# Patient Record
Sex: Male | Born: 1940
Health system: Southern US, Community
[De-identification: ages and names within clinical notes are randomized; demographics above are authoritative.]

## PROBLEM LIST (undated history)

## (undated) DIAGNOSIS — I10 Essential (primary) hypertension: Secondary | ICD-10-CM

## (undated) DIAGNOSIS — Z789 Other specified health status: Secondary | ICD-10-CM

## (undated) DIAGNOSIS — Z7289 Other problems related to lifestyle: Secondary | ICD-10-CM

## (undated) DIAGNOSIS — F419 Anxiety disorder, unspecified: Secondary | ICD-10-CM

## (undated) DIAGNOSIS — K219 Gastro-esophageal reflux disease without esophagitis: Secondary | ICD-10-CM

## (undated) HISTORY — PX: OTHER SURGICAL HISTORY: SHX169

---

## 2005-05-11 ENCOUNTER — Ambulatory Visit: Payer: Self-pay | Admitting: Pain Medicine

## 2005-05-15 ENCOUNTER — Ambulatory Visit: Payer: Self-pay | Admitting: Pain Medicine

## 2005-06-20 ENCOUNTER — Ambulatory Visit: Payer: Self-pay | Admitting: Pain Medicine

## 2005-07-03 ENCOUNTER — Ambulatory Visit: Payer: Self-pay | Admitting: Pain Medicine

## 2012-08-30 ENCOUNTER — Inpatient Hospital Stay: Payer: Self-pay | Admitting: Internal Medicine

## 2012-08-30 LAB — CSF CELL COUNT WITH DIFFERENTIAL
CSF Tube #: 3
Eosinophil: 0 %
Eosinophil: 0 %
Lymphocytes: 65 %
Monocytes/Macrophages: 35 %
Monocytes/Macrophages: 38 %
Neutrophils: 0 %
Neutrophils: 25 %
Other Cells: 0 %
RBC (CSF): 0 /mm3
RBC (CSF): 32 /mm3

## 2012-08-30 LAB — COMPREHENSIVE METABOLIC PANEL
Alkaline Phosphatase: 95 U/L (ref 50–136)
Anion Gap: 11 (ref 7–16)
Calcium, Total: 9.2 mg/dL (ref 8.5–10.1)
Chloride: 99 mmol/L (ref 98–107)
Co2: 24 mmol/L (ref 21–32)
Creatinine: 1.23 mg/dL (ref 0.60–1.30)
EGFR (Non-African Amer.): 58 — ABNORMAL LOW
Glucose: 134 mg/dL — ABNORMAL HIGH (ref 65–99)
Potassium: 4.1 mmol/L (ref 3.5–5.1)
SGPT (ALT): 14 U/L (ref 12–78)
Sodium: 134 mmol/L — ABNORMAL LOW (ref 136–145)

## 2012-08-30 LAB — CBC WITH DIFFERENTIAL/PLATELET
HCT: 35.5 % — ABNORMAL LOW (ref 40.0–52.0)
HGB: 11.9 g/dL — ABNORMAL LOW (ref 13.0–18.0)
Lymphocytes: 10 %
MCHC: 33.6 g/dL (ref 32.0–36.0)
MCV: 91 fL (ref 80–100)
Platelet: 228 10*3/uL (ref 150–440)
RBC: 3.92 10*6/uL — ABNORMAL LOW (ref 4.40–5.90)
RDW: 12.7 % (ref 11.5–14.5)
Segmented Neutrophils: 84 %
WBC: 18.8 10*3/uL — ABNORMAL HIGH (ref 3.8–10.6)

## 2012-08-30 LAB — CBC
HCT: 38.5 % — ABNORMAL LOW (ref 40.0–52.0)
HGB: 12.8 g/dL — ABNORMAL LOW (ref 13.0–18.0)
MCH: 30.6 pg (ref 26.0–34.0)
MCHC: 33.3 g/dL (ref 32.0–36.0)
Platelet: 261 10*3/uL (ref 150–440)
RBC: 4.18 10*6/uL — ABNORMAL LOW (ref 4.40–5.90)
RDW: 12.9 % (ref 11.5–14.5)
WBC: 23.8 10*3/uL — ABNORMAL HIGH (ref 3.8–10.6)

## 2012-08-30 LAB — URINALYSIS, COMPLETE
Bilirubin,UR: NEGATIVE
Glucose,UR: NEGATIVE mg/dL (ref 0–75)
Leukocyte Esterase: NEGATIVE
Nitrite: NEGATIVE
Ph: 5 (ref 4.5–8.0)
Specific Gravity: 1.021 (ref 1.003–1.030)
Squamous Epithelial: 1
WBC UR: 2 /HPF (ref 0–5)

## 2012-08-30 LAB — GLUCOSE, CSF: Glucose, CSF: 78 mg/dL — ABNORMAL HIGH (ref 40–75)

## 2012-08-31 LAB — CBC WITH DIFFERENTIAL/PLATELET
HCT: 34.8 % — ABNORMAL LOW (ref 40.0–52.0)
HGB: 11.7 g/dL — ABNORMAL LOW (ref 13.0–18.0)
Lymphocyte #: 1.3 10*3/uL (ref 1.0–3.6)
Lymphocyte %: 14.3 %
MCH: 30.6 pg (ref 26.0–34.0)
MCHC: 33.7 g/dL (ref 32.0–36.0)
MCV: 91 fL (ref 80–100)
Monocyte #: 0.8 x10 3/mm (ref 0.2–1.0)
Monocyte %: 9.1 %
Neutrophil %: 75.4 %
Platelet: 213 10*3/uL (ref 150–440)
RBC: 3.82 10*6/uL — ABNORMAL LOW (ref 4.40–5.90)
RDW: 12.6 % (ref 11.5–14.5)

## 2012-09-02 LAB — CSF CULTURE W GRAM STAIN

## 2012-09-04 LAB — CULTURE, BLOOD (SINGLE)

## 2014-02-07 ENCOUNTER — Emergency Department: Payer: Self-pay | Admitting: Emergency Medicine

## 2014-02-08 LAB — COMPREHENSIVE METABOLIC PANEL
ALK PHOS: 127 U/L — AB
Albumin: 3.8 g/dL (ref 3.4–5.0)
Anion Gap: 10 (ref 7–16)
BUN: 8 mg/dL (ref 7–18)
Bilirubin,Total: 0.5 mg/dL (ref 0.2–1.0)
CO2: 24 mmol/L (ref 21–32)
Calcium, Total: 8.5 mg/dL (ref 8.5–10.1)
Chloride: 97 mmol/L — ABNORMAL LOW (ref 98–107)
Creatinine: 0.95 mg/dL (ref 0.60–1.30)
EGFR (African American): >60
Glucose: 82 mg/dL (ref 65–99)
Osmolality: 260 (ref 275–301)
Potassium: 3.8 mmol/L (ref 3.5–5.1)
SGOT(AST): 23 U/L (ref 15–37)
SGPT (ALT): 14 U/L (ref 12–78)
SODIUM: 131 mmol/L — AB (ref 136–145)
TOTAL PROTEIN: 7.4 g/dL (ref 6.4–8.2)

## 2014-02-08 LAB — CBC
HCT: 38.1 % — ABNORMAL LOW (ref 40.0–52.0)
HGB: 13 g/dL (ref 13.0–18.0)
MCH: 33.3 pg (ref 26.0–34.0)
MCHC: 34.1 g/dL (ref 32.0–36.0)
MCV: 98 fL (ref 80–100)
Platelet: 282 10*3/uL (ref 150–440)
RBC: 3.91 10*6/uL — ABNORMAL LOW (ref 4.40–5.90)
RDW: 13 % (ref 11.5–14.5)
WBC: 7.2 10*3/uL (ref 3.8–10.6)

## 2014-02-08 LAB — ETHANOL
ETHANOL %: 0.259 % — AB (ref 0.000–0.080)
Ethanol: 259 mg/dL

## 2014-02-08 LAB — LIPASE, BLOOD: LIPASE: 167 U/L (ref 73–393)

## 2014-11-20 NOTE — Consult Note (Signed)
PATIENT NAME:  Joshua Lynn, Joshua Lynn MR#:  [id number] DATE OF BIRTH:  [birth year]  DATE OF CONSULTATION:  08/30/2012  REFERRING PHYSICIAN:  Theodoro Grist, MD CONSULTING PHYSICIAN:  Heinz Knuckles. Franziska Podgurski, MD  REASON FOR CONSULTATION: Possible meningitis.   HISTORY OF PRESENT ILLNESS: The patient is a 74 year old male with a history of anxiety who presented with neck pain over the last several days. He has a history of head trauma in the distant past and an eye injury with subsequent enucleation in the 1980s, but has not had chronic neck and back pain. About 4 days ago, he began to develop progressive pain in the neck, mainly posteriorly with radiating into the posterior head. He has decreased range of motion, especially with looking left or right. The headache is mainly posterior. He has not had any fevers, chills or sweats. He has not had any photophobia. He has not had any focal weakness. He presented to the Emergency Room and was found to have an elevated white count. A lumbar puncture was performed, which was fairly unremarkable. He was started on antimeningitis doses of ceftriaxone, vancomycin, ampicillin, and started on acyclovir. The patient has had no confusion. He feels well except for his neck pain. He denies any recent trauma to the neck. As recently as this past summer, he re-roofed his house and has generally been fairly active.   ALLERGIES: None.   PAST MEDICAL HISTORY:  1.  Anxiety.  2.  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  3.  Head injury in the past.  4.  Chronic alcohol abuse. He has not been drinking in 76 days.  5.  Anxiety.  6.  GERD.  SOCIAL HISTORY: The patient lives with a 84 year old step-grandson. He does not smoke, having quit 40 years ago. He had a history of heavy drinking but quit a month and a half ago. No injecting drug use history.   FAMILY HISTORY: Positive for Alzheimer's and suicidality.   REVIEW OF SYSTEMS:  GENERAL: No fevers, chills, sweats or malaise.  HEENT:  Positive headaches, mainly posteriorly. No sinus congestion. No sore throat.  NECK: Positive neck pain. No significant stiffness, but he does have pain with movement of his neck in all directions. No lymphadenopathy.  CHEST: No shortness of breath. No cough. No sputum production.  CARDIAC: No chest pains or palpitations. No peripheral edema.  GASTROINTESTINAL:  No nausea, no vomiting, no abdominal pain, no change in his bowels.  GENITOURINARY: No change in his urine.  MUSCULOSKELETAL: Other than the neck pain, he has no other focal muscle or joint complaints.  SKIN: No rashes.  NEUROLOGIC: No focal weakness. No urinary bladder incontinence. No confusion.  PSYCHIATRIC: He has significant anxiety at times, and over the last several days he has had trouble sitting still, which he attributes to his anxiety. All other systems are negative.   PHYSICAL EXAMINATION:  VITAL SIGNS: T-max 99.1, T-current 98.3, pulse 91, blood pressure 123/69, 96% on room air.  GENERAL: A 74 year old white man in no acute distress.  HEENT: Normocephalic, atraumatic. Pupils equal and reactive to light. Extraocular motion intact. Sclerae, conjunctivae and lids are without evidence for emboli or petechiae. Oropharynx shows no erythema or exudate. Gums are in fair condition.  NECK: He has tenderness along the posterior aspect of the neck radiating into the occipital region. The occipital muscles are also tender. He is able to move his neck forward, but complains of muscular pain with this. He has no lymphadenopathy. Midline trachea. No thyromegaly.  LUNGS:  Clear to auscultation bilaterally with good air movement. No focal consolidation.  HEART: Regular rate and rhythm without murmur, rub or gallop.  ABDOMEN: Soft, nontender, nondistended. No hepatosplenomegaly. No hernias noted.  EXTREMITIES: No evidence for tenosynovitis.  SKIN: No rashes. No stigmata of endocarditis, specifically no Janeway lesions or Osler nodes.   NEUROLOGIC: The patient is awake and interactive, moving all 4 extremities. He has negative Kernig and Brudzinski sign.  PSYCHIATRIC: Mood and affect appeared normal.   LABORATORY, DIAGNOSTIC AND RADIOLOGICAL DATA: BUN 17, creatinine 1.23, bicarbonate 99, anion gap 24. LFTs were unremarkable except for total bilirubin of 1.3. White count is 18.8 with a hemoglobin of 11.9, platelet count of 228 with 84% neutrophils. His white count just after midnight last night was 23.8. CSF studies in tube 1 showed 32 red cells, 7 white cells. Tube 3 had zero red cells and 9 white cells with 65% lymphocytes, 35% monocytes. His glucose was 78 and protein was 32. CSF Gram stain shows no organisms and rare white cells. Urinalysis was unremarkable. A CT scan of the head without contrast shows changes of atrophy but no acute abnormalities. A CT scan of the neck without contrast showed severe chronic degenerative changes. A chest x-ray showed no focal pneumonia.   IMPRESSION: A 74 year old man with a history of anxiety admitted with neck pain and leukocytosis.   RECOMMENDATIONS:  1.  He does not have meningitis clinically. He has neck pain but no true stiffness. His Kernig and Brudzinski signs are negative. His LP is not very impressive, especially given his peripheral leukocytosis. He does have leukocytosis, however, without an obvious etiology.  2.  Would focus on his musculoskeletal symptoms for his neck pain. His CT did show severe degenerative cervical disease.  3.  Will stop all his antibiotics and his antivirals, other than ceftriaxone. Will change the dose to 1 gram daily.  4.  Will await the blood culture results. If negative, would stop the antibiotics.  5.  He does not see a physician very often. If his white count remains up without any focal infectious symptoms, would consider CML as a possibility.  6.  Will discontinue isolation.   This is a moderately complex infectious disease case. Thank you very much  for involving me in this patient's care.    ____________________________ Heinz Knuckles. Jamey Harman, MD meb:jm D: 08/30/2012 16:42:28 ET T: 08/30/2012 20:31:32 ET JOB#: [id number]  cc: Heinz Knuckles. Cai Flott, MD, <Dictator> Daimen Shovlin E Clotile Whittington MD ELECTRONICALLY SIGNED 09/02/2012 9:14

## 2014-11-20 NOTE — H&P (Signed)
PATIENT NAME:  Joshua Lynn, Joshua Lynn MR#:  [id number] DATE OF BIRTH:  14-Oct-1940  DATE OF ADMISSION:  08/30/2012  CHIEF COMPLAINT: Back pain.  HISTORY OF PRESENT ILLNESS: A 74 year old male with history of alcohol abuse, chronic back pain presents with 4 days' duration of progressive neck pain. [name] was in his usual state of health until about 4 days ago when he started to develop progressive neck pain associated with decreased range of motion, particularly in rotation and flexion of his neck. He then developed a headache. Headache is described as bitemporal and also occipital in nature. He feels it is associated with his neck pain. He denies any fevers, any sick contacts or recent illnesses; however, due to the severity of his symptoms, he presented to the Emergency Department. The initial workup in the Emergency Department involved a CT of the neck because the concern was for possible musculoskeletal etiology. However, when his CBC was reviewed, he was noted to have leukocytosis of 23,000, so therefore there was a concern for meningitis. He has subsequently had a spinal tap. CSF studies are pending. He has been started on empiric antibiotics. We are thus consulted for admission.  On the date of presentation, he notes that he had some generalized weakness in his legs he noted. He would walk and sit, and then walk and sit and just felt overall fatigued. He did not note any focal weakness or paresthesias.   PAST MEDICAL HISTORY:  1. Chronic back pain.  2. 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  3. History of chronic alcohol abuse, he quit 76 days ago.  4. Anxiety.  5. GERD.  PAST SURGICAL HISTORY: Eye extraction, left eye. The patient has a glass eye in place.   MEDICATIONS: Tramadol 50 mg q.6 hours as needed for pain. The patient also reports that he takes Percocet, a medication for anxiety and a medication for reflux; however, these are not listed on our admission medication  reconciliation.   ALLERGIES: No known drug allergies.   SOCIAL HISTORY: Denies tobacco use; quit 40 years ago. History of alcohol abuse; quit 76 days ago. Used to drink a 12 pack of beer daily. Denies illicit drug use.   FAMILY HISTORY: He is raising a 2 year old son. His mother passed away at 28 with Alzheimer's. His father committed suicide. His brother committed suicide. He has three living sisters. He denies family history of myocardial infarctions, cerebrovascular accidents, diabetes mellitus or cancer.   REVIEW OF SYSTEMS:  CONSTITUTIONAL: Denies fever, fatigue. Admits to some leg weakness.  EYES: Denied blurred vision, double vision or eye pain. Is blind in the left eye.  ENT: Admits to some right ear fullness. Denies any tinnitus or ear pain. He does have some hearing loss chronically and hearing difficulty.  RESPIRATIONS: Denies cough, wheezing, dyspnea, painful respirations.  CARDIOVASCULAR: Denies chest pain, shortness of breath, palpitations.  GASTROINTESTINAL: Admits to some nausea but no vomiting, diarrhea, no abdominal pain.  GENITOURINARY: Denies frequency or dysuria.  ENDOCRINE: Denies increased thirst.  INTEGUMENT: Denies any new skin rashes.  MUSCULOSKELETAL: Has chronic back pain. Reports neck pain. Denies any joint swelling or any joint redness.  NEUROLOGIC: Denies paresthesias. Admits to headache. Denies CVA or seizures. Reports some weakness in his legs that required him to sit down, but no focal neurological losses.  PHYSICAL EXAMINATION: VITAL SIGNS: Temp 98.2, heart rate 87 to 100, blood pressure 115/60, satting 96% on room air.  GENERAL: Elderly Caucasian male in no apparent distress.  PSYCHIATRIC:  The patient is awake, alert, oriented x 3. Judgment appears intact.  EYES: Extraocular muscles are intact. There is a glass orbit in the left eye. Anicteric sclerae.  ENT: Poor dentition. Tympanic membranes are intact. There is no erythema noted. Posterior oropharynx  is clear without exudates.  NECK: The patient is able to flex his neck without any meningeal signs appreciated. The neck is supple.  CARDIOVASCULAR: S1, S2. Regular rate and rhythm. No murmurs appreciated.  CHEST: Clear to auscultation bilaterally. No wheezes, rales or rhonchi.  ABDOMEN: Soft, nontender, nondistended with normal bowel sounds.  MUSCULOSKELETAL: Full range of motion in bilateral upper and lower extremities. No deficits in sensation. No clubbing or cyanosis appreciated.   LABORATORY DATA: Glucose 134, BUN of 17, creatinine 1.23, sodium 134, potassium 4.1, chloride 99, bicarb 24, GFR 58, anion gap of 11, osmolality 272, calcium 9.2.   Total protein 7.8, albumin 3.8, bilirubin 1.3, alkaline phosphatase 95, AST 15, ALT 14.   White count 23.8, hemoglobin 12.9, hematocrit 38.5, platelets 261, MCV of 92.   CSF: Tube 1 is colorless and clear, 32 red blood cells, 7 white blood cells, 25 neutrophils, 38 lymphocytes, 78 glucose and 32 protein. Tube 3 shows clearance of the RBC cells, 9 white blood cells, 0 neutrophils, 65 lymphocytes. CSF culture on Gram stain 1 shows rare white blood cells. Gram stain 2 shows no organisms.   Urinalysis shows 1+ ketones, 30 protein, negative nitrites and leukocytes.   CT of the cervical spine shows no evidence of acute fracture and no evidence of malalignment. There is moderate multilevel degenerative spondylolisthesis with diffuse posterior disc protrusions at C3-4 through C6-7.   ASSESSMENT AND PLAN: A 74 year old male presenting with progressive severe neck pain, found to have leukocytosis. Although afebrile, concerning for possible meningitis. At this point, it might be aseptic meningitis.   1. Headache and neck pain, concern for meningitis. Will follow up on the final CSF cultures as well as CSF HSV PCR. Blood cultures have been sent as well. Will repeat CBC today as well as in the a.m. Empiric antibiotics have been initiated with vancomycin,  ceftriaxone, ampicillin and acyclovir. Will check an MRI of the brain. If meningitis is ruled out, we will deescalate antibiotics and antiviral medications, and consider possible musculoskeletal etiology.  2. Leukocytosis. Again, the patient appears very nontoxic; however, given leukocytosis and presentation, we will evaluate for meningitis. We will repeat CBC to assure that the specimen is appropriate.  3. Alcohol abuse. The patient has been alcohol free for 76 days. Will hold off on CIWA protocol. 4. Gastroesophageal reflux disease. Stable.  5. Anxiety. Stable.  6. Chronic back pain. Stable. Continue p.r.n. pain medications.  7. Code status. The patient is a DO NOT RESUSCITATE. This was discussed in the presence of his aunt, who he designated as his Air traffic controller.  Please note that the patient also has a sister who is unaware of his code status. I have asked that he please inform his sister, especially if he feels that she will be involved in his medical decision making, which the aunt clearly states that she will and the patient agrees.   DISPOSITION: The patient is being admitted inpatient for evaluation and management of possible meningitis, underlying etiology unclear. I anticipate he will require at least two hospital midnights for management.   TIME SPENT COORDINATING ADMISSION: 50 minutes.    ____________________________ Samson Frederic, DO aeo:es D: 08/30/2012 07:05:33 ET T: 08/30/2012 09:51:03 ET JOB#: [id number]  cc: Essie Christine  Leonard Schwartz, DO, <Dictator> Luba Matzen E Demarqus Jocson DO ELECTRONICALLY SIGNED 09/24/2012 3:48

## 2014-11-20 NOTE — Consult Note (Signed)
Impression: 74yo male w/ h/o anxiety admitted with neck pain and leukocytosis.  He does not have meningitis clinically.  He has neck pain, but no true stiffness.  His Kernig's and Brudzinski's sign are negative.  His LP is not very impressive.  He does have leukocytosis without any obvious etiology. Will stop all his antibiotics and antivirals other than ceftriaxone.  Will change dose to daily. Await BCx results.  If negative, would stop antibiotics. He does not see a physician very often.  If his WBC remains up without any focal infectious symptoms, would consider CML. Will d/c isolation.  Electronic Signatures: Atina Feeley, Heinz Knuckles (MD)  (Signed on 31-Jan-14 16:28)  Authored  Last Updated: 31-Jan-14 16:28 by Ephrata Verville, Heinz Knuckles (MD)

## 2014-11-20 NOTE — Discharge Summary (Signed)
PATIENT NAME:  Joshua Lynn, Joshua Lynn MR#:  [id number] DATE OF BIRTH:  1940/10/14  DATE OF ADMISSION:  08/30/2012 DATE OF DISCHARGE:  08/31/2012  PRIMARY CARE PHYSICIAN: Dr. Lisbeth Ply.   DISCHARGE DIAGNOSES:  1.  Cervicalgia.   2.  Leukocytosis. 3.  Hyponatremia.   IMAGING STUDIES: Done include a chest x-ray showed no acute abnormalities.   CT of the head without contrast, which showed changes of atrophy and a prosthetic globe in the right orbit.   Cervical spine CT showed severe chronic degenerative disease. No acute fractures or dislocation.   ADMITTING HISTORY AND PHYSICAL AND HOSPITAL COURSE: The patient is a 74 year old male patient presented to the Emergency Room complaining of severe neck pain. He was found to have leukocytosis and had an LP done for concern of meningitis. The patient did not have any neck stiffness. He was admitted to the hospitalist service for further work-up and treatment.   HOSPITAL COURSE:  The patient was admitted to the hospitalist service. LP did not show any signs of infection. Normal protein, normal RBC, WBC. Dr. Clayborn Bigness of ID was consulted, who suggested tapering down antibiotics to ceftriaxone for possibility of infection and meningitis was ruled out with cultures negative. The patient will be continued on third generation of cephalosporin of cefuroxime for three more days as he did well on ceftriaxone. No source of infection is found. Leukocytosis could likely be secondary to stress reaction. The patient's neck pain is much improved on pain medications and muscle relaxants. CT of the cervical spine showed no fractures or dislocation. On the day of discharge, the patient's blood pressure is 134/74, temperature 98.3, and has been discharged home in fair condition.   DISCHARGE MEDICATIONS:  Include:   1.  Tramadol 50 mg oral every 4 hours as needed for pain.  2.  Xanax 0.5 mg oral once a day as needed for anxiety.  3.  Orphenadrine 100 mg oral once a day as needed.  4.   Acetaminophen oxycodone 325/5 one tablet oral 3 times a day as needed for pain.  5.  Cefuroxime 500 mg 1 tablet oral 2 times a day for 3 days.   DISCHARGE INSTRUCTIONS: Follow up with primary care physician in a week. Call doctor or return to the Emergency Room if any fever or chills.   DIET:  Regular diet.   ACTIVITY:  Activity as tolerated.   TIME SPENT: On day of discharge in discharge activity was 35 minutes.    ____________________________ Leia Alf Orlean Holtrop, MD srs:cc D: 08/31/2012 13:21:20 ET T: 09/01/2012 16:20:11 ET JOB#: [id number]  cc: Alveta Heimlich R. Calab Sachse, MD, <Dictator> Maura L. Hamrick, MD Neita Carp MD ELECTRONICALLY SIGNED 09/05/2012 13:19

## 2015-09-22 DIAGNOSIS — Z6828 Body mass index (BMI) 28.0-28.9, adult: Secondary | ICD-10-CM | POA: Diagnosis not present

## 2015-09-22 DIAGNOSIS — M545 Low back pain: Secondary | ICD-10-CM | POA: Diagnosis not present

## 2015-09-22 DIAGNOSIS — Z79899 Other long term (current) drug therapy: Secondary | ICD-10-CM | POA: Diagnosis not present

## 2016-01-03 DIAGNOSIS — M545 Low back pain: Secondary | ICD-10-CM | POA: Diagnosis not present

## 2016-01-03 DIAGNOSIS — Z6828 Body mass index (BMI) 28.0-28.9, adult: Secondary | ICD-10-CM | POA: Diagnosis not present

## 2016-01-03 DIAGNOSIS — Z9181 History of falling: Secondary | ICD-10-CM | POA: Diagnosis not present

## 2016-01-03 DIAGNOSIS — Z79899 Other long term (current) drug therapy: Secondary | ICD-10-CM | POA: Diagnosis not present

## 2016-01-03 DIAGNOSIS — E663 Overweight: Secondary | ICD-10-CM | POA: Diagnosis not present

## 2016-04-06 DIAGNOSIS — R351 Nocturia: Secondary | ICD-10-CM | POA: Diagnosis not present

## 2016-04-06 DIAGNOSIS — Z6828 Body mass index (BMI) 28.0-28.9, adult: Secondary | ICD-10-CM | POA: Diagnosis not present

## 2016-04-06 DIAGNOSIS — Z79899 Other long term (current) drug therapy: Secondary | ICD-10-CM | POA: Diagnosis not present

## 2016-04-06 DIAGNOSIS — M545 Low back pain: Secondary | ICD-10-CM | POA: Diagnosis not present

## 2016-04-06 DIAGNOSIS — I10 Essential (primary) hypertension: Secondary | ICD-10-CM | POA: Diagnosis not present

## 2016-04-06 DIAGNOSIS — Z139 Encounter for screening, unspecified: Secondary | ICD-10-CM | POA: Diagnosis not present

## 2016-07-27 DIAGNOSIS — Z79899 Other long term (current) drug therapy: Secondary | ICD-10-CM | POA: Diagnosis not present

## 2016-07-27 DIAGNOSIS — M545 Low back pain: Secondary | ICD-10-CM | POA: Diagnosis not present

## 2016-07-27 DIAGNOSIS — M542 Cervicalgia: Secondary | ICD-10-CM | POA: Diagnosis not present

## 2016-07-27 DIAGNOSIS — Z1389 Encounter for screening for other disorder: Secondary | ICD-10-CM | POA: Diagnosis not present

## 2016-07-27 DIAGNOSIS — I10 Essential (primary) hypertension: Secondary | ICD-10-CM | POA: Diagnosis not present

## 2016-11-21 DIAGNOSIS — I10 Essential (primary) hypertension: Secondary | ICD-10-CM | POA: Diagnosis not present

## 2016-11-21 DIAGNOSIS — K219 Gastro-esophageal reflux disease without esophagitis: Secondary | ICD-10-CM | POA: Diagnosis not present

## 2016-11-21 DIAGNOSIS — R7303 Prediabetes: Secondary | ICD-10-CM | POA: Diagnosis not present

## 2016-11-21 DIAGNOSIS — E7800 Pure hypercholesterolemia, unspecified: Secondary | ICD-10-CM | POA: Diagnosis not present

## 2016-11-21 DIAGNOSIS — M542 Cervicalgia: Secondary | ICD-10-CM | POA: Diagnosis not present

## 2016-11-21 DIAGNOSIS — Z125 Encounter for screening for malignant neoplasm of prostate: Secondary | ICD-10-CM | POA: Diagnosis not present

## 2017-03-08 DIAGNOSIS — Z79899 Other long term (current) drug therapy: Secondary | ICD-10-CM | POA: Diagnosis not present

## 2017-03-08 DIAGNOSIS — I10 Essential (primary) hypertension: Secondary | ICD-10-CM | POA: Diagnosis not present

## 2017-03-08 DIAGNOSIS — M545 Low back pain: Secondary | ICD-10-CM | POA: Diagnosis not present

## 2017-03-08 DIAGNOSIS — Z6828 Body mass index (BMI) 28.0-28.9, adult: Secondary | ICD-10-CM | POA: Diagnosis not present

## 2017-03-08 DIAGNOSIS — K219 Gastro-esophageal reflux disease without esophagitis: Secondary | ICD-10-CM | POA: Diagnosis not present

## 2017-03-08 DIAGNOSIS — M542 Cervicalgia: Secondary | ICD-10-CM | POA: Diagnosis not present

## 2017-03-08 DIAGNOSIS — Z9181 History of falling: Secondary | ICD-10-CM | POA: Diagnosis not present

## 2018-01-04 DIAGNOSIS — M545 Low back pain: Secondary | ICD-10-CM | POA: Diagnosis not present

## 2018-01-04 DIAGNOSIS — I10 Essential (primary) hypertension: Secondary | ICD-10-CM | POA: Diagnosis not present

## 2018-01-04 DIAGNOSIS — F419 Anxiety disorder, unspecified: Secondary | ICD-10-CM | POA: Diagnosis not present

## 2018-01-04 DIAGNOSIS — M542 Cervicalgia: Secondary | ICD-10-CM | POA: Diagnosis not present

## 2018-03-22 DIAGNOSIS — M545 Low back pain: Secondary | ICD-10-CM | POA: Diagnosis not present

## 2018-03-22 DIAGNOSIS — Z6827 Body mass index (BMI) 27.0-27.9, adult: Secondary | ICD-10-CM | POA: Diagnosis not present

## 2018-03-22 DIAGNOSIS — L219 Seborrheic dermatitis, unspecified: Secondary | ICD-10-CM | POA: Diagnosis not present

## 2018-03-22 DIAGNOSIS — M542 Cervicalgia: Secondary | ICD-10-CM | POA: Diagnosis not present

## 2018-05-16 DIAGNOSIS — I10 Essential (primary) hypertension: Secondary | ICD-10-CM | POA: Diagnosis not present

## 2018-05-16 DIAGNOSIS — R7303 Prediabetes: Secondary | ICD-10-CM | POA: Diagnosis not present

## 2018-05-16 DIAGNOSIS — R413 Other amnesia: Secondary | ICD-10-CM | POA: Diagnosis not present

## 2018-05-16 DIAGNOSIS — Z125 Encounter for screening for malignant neoplasm of prostate: Secondary | ICD-10-CM | POA: Diagnosis not present

## 2018-05-16 DIAGNOSIS — E7800 Pure hypercholesterolemia, unspecified: Secondary | ICD-10-CM | POA: Diagnosis not present

## 2018-06-18 ENCOUNTER — Emergency Department: Payer: Medicare Other

## 2018-06-18 ENCOUNTER — Emergency Department
Admission: EM | Admit: 2018-06-18 | Discharge: 2018-06-18 | Disposition: A | Discharge status: Home / Self Care | Payer: Medicare Other | Attending: Emergency Medicine | Admitting: Emergency Medicine

## 2018-06-18 ENCOUNTER — Other Ambulatory Visit: Payer: Self-pay

## 2018-06-18 DIAGNOSIS — M542 Cervicalgia: Secondary | ICD-10-CM | POA: Insufficient documentation

## 2018-06-18 DIAGNOSIS — M549 Dorsalgia, unspecified: Secondary | ICD-10-CM | POA: Diagnosis not present

## 2018-06-18 DIAGNOSIS — I10 Essential (primary) hypertension: Secondary | ICD-10-CM | POA: Diagnosis not present

## 2018-06-18 HISTORY — DX: Essential (primary) hypertension: I10

## 2018-06-18 LAB — URINALYSIS, COMPLETE (UACMP) WITH MICROSCOPIC
BILIRUBIN URINE: NEGATIVE
Bacteria, UA: NONE SEEN
Glucose, UA: NEGATIVE mg/dL
Hgb urine dipstick: NEGATIVE
Ketones, ur: 5 mg/dL — AB
LEUKOCYTES UA: NEGATIVE
Nitrite: NEGATIVE
Protein, ur: NEGATIVE mg/dL
SPECIFIC GRAVITY, URINE: 1.013 (ref 1.005–1.030)
SQUAMOUS EPITHELIAL / LPF: NONE SEEN (ref 0–5)
pH: 6 (ref 5.0–8.0)

## 2018-06-18 LAB — CBC
HEMATOCRIT: 32.8 % — AB (ref 39.0–52.0)
Hemoglobin: 11.4 g/dL — ABNORMAL LOW (ref 13.0–17.0)
MCH: 33.1 pg (ref 26.0–34.0)
MCHC: 34.8 g/dL (ref 30.0–36.0)
MCV: 95.3 fL (ref 80.0–100.0)
PLATELETS: 255 10*3/uL (ref 150–400)
RBC: 3.44 MIL/uL — AB (ref 4.22–5.81)
RDW: 11.9 % (ref 11.5–15.5)
WBC: 8.9 10*3/uL (ref 4.0–10.5)
nRBC: 0 % (ref 0.0–0.2)

## 2018-06-18 LAB — COMPREHENSIVE METABOLIC PANEL
ALT: 11 U/L (ref 0–44)
AST: 23 U/L (ref 15–41)
Albumin: 4.1 g/dL (ref 3.5–5.0)
Alkaline Phosphatase: 58 U/L (ref 38–126)
Anion gap: 12 (ref 5–15)
BUN: 17 mg/dL (ref 8–23)
CALCIUM: 9.1 mg/dL (ref 8.9–10.3)
CHLORIDE: 95 mmol/L — AB (ref 98–111)
CO2: 20 mmol/L — ABNORMAL LOW (ref 22–32)
CREATININE: 0.92 mg/dL (ref 0.61–1.24)
Glucose, Bld: 103 mg/dL — ABNORMAL HIGH (ref 70–99)
Potassium: 4.2 mmol/L (ref 3.5–5.1)
Sodium: 127 mmol/L — ABNORMAL LOW (ref 135–145)
Total Bilirubin: 1.4 mg/dL — ABNORMAL HIGH (ref 0.3–1.2)
Total Protein: 7.7 g/dL (ref 6.5–8.1)

## 2018-06-18 LAB — ETHANOL: Alcohol, Ethyl (B): <10 mg/dL (ref <10)

## 2018-06-18 MED ORDER — PREDNISONE 20 MG PO TABS: 40.0000 mg | ORAL_TABLET | Freq: Every day | ORAL | 0 refills | Status: DC

## 2018-06-18 MED ORDER — MORPHINE SULFATE (PF) 4 MG/ML IV SOLN
4.0000 mg | Freq: Once | INTRAVENOUS | Status: AC
  Administered 2018-06-18: 4 mg via INTRAVENOUS
  Filled 2018-06-18: qty 1

## 2018-06-18 MED ORDER — SODIUM CHLORIDE 0.9 % IV BOLUS
1000.0000 mL | Freq: Once | INTRAVENOUS | Status: AC
  Administered 2018-06-18: 1000 mL via INTRAVENOUS

## 2018-06-18 NOTE — ED Provider Notes (Signed)
[facility] Emergency Department Provider Note  Time seen: 4:32 PM  I have reviewed the triage vital signs and the nursing notes.   HISTORY  Chief Complaint Shoulder Pain    HPI Joshua Lynn is a 78 y.o. male with a past medical history of hypertension who presents to the emergency department for neck pain.  According to the patient he awoke this morning with pain in the left side of his neck, denies any trauma or falls.  Denies any history of chronic neck pain.  Patient states he came to be evaluated because of the pain.  Patient is somewhat disheveled appearance and smells of urine, lives alone.  Patient does admit to alcohol use, only drinks beer per patient.  Largely negative review of systems besides the neck pain.  No weakness or numbness.   Past Medical History:  Diagnosis Date  . Hypertension     There are no active problems to display for this patient.   History reviewed. No pertinent surgical history.  Prior to Admission medications   Not on File    Not on File  History reviewed. No pertinent family history.  Social History Social History   Tobacco Use  . Smoking status: Never Smoker  . Smokeless tobacco: Never Used  Substance Use Topics  . Alcohol use: Yes  . Drug use: Never    Review of Systems Constitutional: Negative for fever. Cardiovascular: Negative for chest pain. Respiratory: Negative for shortness of breath. Gastrointestinal: Negative for abdominal pain, vomiting  Genitourinary: Negative for urinary compaints Musculoskeletal: Positive for left-sided neck pain Skin: Negative for skin complaints  Neurological: Negative for headache All other ROS negative  ____________________________________________   PHYSICAL EXAM:  VITAL SIGNS: ED Triage Vitals [06/18/18 1625]  Enc Vitals Group     BP (!) 145/90     Pulse Rate 86     Resp (!) 22     Temp 97.8 F (36.6 C)     Temp Source Oral     SpO2 96 %     Weight  120 lb (54.4 kg)     Height 5\' 2"  (1.575 m)     Head Circumference      Peak Flow      Pain Score 9     Pain Loc      Pain Edu?      Excl. in [place]?    Constitutional: Alert and oriented. Well appearing and in no distress. Eyes: Normal exam ENT   Head: Normocephalic and atraumatic.   Mouth/Throat: Mucous membranes are moist. Cardiovascular: Normal rate, regular rhythm. No murmur Respiratory: Normal respiratory effort without tachypnea nor retractions. Breath sounds are clear  Gastrointestinal: Soft and nontender. No distention. Musculoskeletal: Nontender with normal range of motion in all extremities.  Neurologic:  Normal speech and language. No gross focal neurologic deficits Skin:  Skin is warm, dry and intact.  Psychiatric: Mood and affect are normal.   ____________________________________________    RADIOLOGY  IMPRESSION: Diffuse degenerative disc and facet disease. No acute bony abnormality.  ____________________________________________   INITIAL IMPRESSION / ASSESSMENT AND PLAN / ED COURSE  Pertinent labs & imaging results that were available during my care of the patient were reviewed by me and considered in my medical decision making (see chart for details).  Patient presents the emergency department for left-sided neck pain.  States he awoke with the pain this morning denies any trauma or falls.  States he has not fallen since he was 77 years  old.  We will obtain CT imaging as a precaution.  The patient smells of urine we will check urinalysis and basic labs as well.  CT scan is negative.  Labs show mild hyponatremia however this is largely unchanged from baseline.  Lab work is otherwise negative.  Urinalysis has resulted negative.  Patient is requesting something to eat and requesting to be discharged.  I believe the patient is safe for discharge home at this time. ____________________________________________   FINAL CLINICAL IMPRESSION(S) / ED  DIAGNOSES  Neck pain    Harvest Dark, MD 06/18/18 2016

## 2018-06-18 NOTE — ED Notes (Signed)
Meal tray given to patient.

## 2018-06-18 NOTE — Discharge Instructions (Signed)
Return to the emergency department for any worsening pain, any weakness or numbness of any arm or leg, any urinary or bowel incontinence, or any other symptom personally concerning to yourself.

## 2018-06-18 NOTE — ED Triage Notes (Signed)
Pt arrived from home with neck and back pain. Pt states that it started a couple of days ago. Pt arrived soaked in urine. Pt NAD but rates pan 9/10. Pt hard of hearing and has a glass eye on the right side. Pt went to [facility] a few months ago and was evaluated for a slipped disk in his neck.

## 2018-06-18 NOTE — ED Notes (Signed)
Notified lab about urine sample that was sent down earlier. States that they will run sample now.

## 2018-06-19 NOTE — ED Notes (Signed)
Received call from friend/caregiver who states that patient says he does not have his discharge papers and he thinks he had a prescription sent in.  I explained that his rx was not sent, and he should have had a paper rx.   He apparently went home in a cab and says he does not have any papers.  Requested that we send to [facility].  I called the rx as written to [facility].

## 2018-09-19 DIAGNOSIS — M545 Low back pain: Secondary | ICD-10-CM | POA: Diagnosis not present

## 2018-09-19 DIAGNOSIS — R413 Other amnesia: Secondary | ICD-10-CM | POA: Diagnosis not present

## 2018-09-19 DIAGNOSIS — R296 Repeated falls: Secondary | ICD-10-CM | POA: Diagnosis not present

## 2018-09-19 DIAGNOSIS — M542 Cervicalgia: Secondary | ICD-10-CM | POA: Diagnosis not present

## 2018-10-04 ENCOUNTER — Emergency Department: Payer: Medicare Other

## 2018-10-04 ENCOUNTER — Emergency Department
Admission: EM | Admit: 2018-10-04 | Discharge: 2018-10-04 | Disposition: A | Discharge status: Home / Self Care | Payer: Medicare Other | Attending: Emergency Medicine | Admitting: Emergency Medicine

## 2018-10-04 ENCOUNTER — Other Ambulatory Visit: Payer: Self-pay

## 2018-10-04 DIAGNOSIS — R0602 Shortness of breath: Secondary | ICD-10-CM | POA: Diagnosis not present

## 2018-10-04 DIAGNOSIS — R0902 Hypoxemia: Secondary | ICD-10-CM | POA: Diagnosis not present

## 2018-10-04 DIAGNOSIS — R079 Chest pain, unspecified: Secondary | ICD-10-CM | POA: Diagnosis not present

## 2018-10-04 DIAGNOSIS — I10 Essential (primary) hypertension: Secondary | ICD-10-CM | POA: Diagnosis not present

## 2018-10-04 DIAGNOSIS — E860 Dehydration: Secondary | ICD-10-CM | POA: Diagnosis not present

## 2018-10-04 DIAGNOSIS — F10920 Alcohol use, unspecified with intoxication, uncomplicated: Secondary | ICD-10-CM | POA: Diagnosis not present

## 2018-10-04 LAB — CBC
HCT: 37.1 % — ABNORMAL LOW (ref 39.0–52.0)
HEMOGLOBIN: 12.3 g/dL — AB (ref 13.0–17.0)
MCH: 31.5 pg (ref 26.0–34.0)
MCHC: 33.2 g/dL (ref 30.0–36.0)
MCV: 94.9 fL (ref 80.0–100.0)
Platelets: 225 10*3/uL (ref 150–400)
RBC: 3.91 MIL/uL — ABNORMAL LOW (ref 4.22–5.81)
RDW: 12.3 % (ref 11.5–15.5)
WBC: 11.2 10*3/uL — ABNORMAL HIGH (ref 4.0–10.5)
nRBC: 0 % (ref 0.0–0.2)

## 2018-10-04 LAB — BASIC METABOLIC PANEL
Anion gap: 13 (ref 5–15)
BUN: 14 mg/dL (ref 8–23)
CO2: 18 mmol/L — ABNORMAL LOW (ref 22–32)
Calcium: 9.1 mg/dL (ref 8.9–10.3)
Chloride: 94 mmol/L — ABNORMAL LOW (ref 98–111)
Creatinine, Ser: 0.86 mg/dL (ref 0.61–1.24)
GFR calc Af Amer: >60 mL/min (ref >60)
GFR calc non Af Amer: >60 mL/min (ref >60)
GLUCOSE: 74 mg/dL (ref 70–99)
POTASSIUM: 4.2 mmol/L (ref 3.5–5.1)
Sodium: 125 mmol/L — ABNORMAL LOW (ref 135–145)

## 2018-10-04 LAB — ETHANOL: Alcohol, Ethyl (B): 35 mg/dL — ABNORMAL HIGH (ref <10)

## 2018-10-04 LAB — LIPASE, BLOOD: Lipase: 39 U/L (ref 11–51)

## 2018-10-04 LAB — TROPONIN I: Troponin I: <0.03 ng/mL (ref <0.03)

## 2018-10-04 LAB — GLUCOSE, CAPILLARY: Glucose-Capillary: 76 mg/dL (ref 70–99)

## 2018-10-04 NOTE — ED Notes (Signed)
Pt repositioned in bed.

## 2018-10-04 NOTE — ED Provider Notes (Signed)
[facility] Emergency Department Provider Note       Time seen: ----------------------------------------- 9:41 PM on 10/04/2018 -----------------------------------------   I have reviewed the triage vital signs and the nursing notes.  HISTORY   Chief Complaint Alcohol Intoxication and Chest Pain    HPI Joshua Lynn is a 78 y.o. male with a history of hypertension who presents to the ED for chest pain or shortness of breath as well as alcohol today.  Patient arrives from home by EMS.  He complains of soreness in his midsternal chest.  He denies a history of same.  Past Medical History:  Diagnosis Date  . Hypertension     There are no active problems to display for this patient.   History reviewed. No pertinent surgical history.  Allergies Patient has no allergy information on record.  Social History Social History   Tobacco Use  . Smoking status: Never Smoker  . Smokeless tobacco: Never Used  Substance Use Topics  . Alcohol use: Yes  . Drug use: Never    Review of Systems Constitutional: Negative for fever. Cardiovascular: Positive for chest pain Respiratory: Positive for shortness of breath Gastrointestinal: Negative for abdominal pain, vomiting and diarrhea. Musculoskeletal: Negative for back pain. Skin: Negative for rash. Neurological: Negative for headaches, focal weakness or numbness.  All systems negative/normal/unremarkable except as stated in the HPI  ____________________________________________   PHYSICAL EXAM:  VITAL SIGNS: ED Triage Vitals  Enc Vitals Group     BP --      Pulse --      Resp --      Temp 10/04/18 2135 98.5 F (36.9 C)     Temp Source 10/04/18 2135 Oral     SpO2 --      Weight 10/04/18 2136 123 lb 7.3 oz (56 kg)     Height 10/04/18 2136 5\' 3"  (1.6 m)     Head Circumference --      Peak Flow --      Pain Score 10/04/18 2132 7     Pain Loc --      Pain Edu? --      Excl. in [place]? --     Constitutional: Alert and oriented. Well appearing and in no distress. Eyes: Conjunctivae are normal. Normal extraocular movements. ENT      Head: Normocephalic and atraumatic.      Nose: No congestion/rhinnorhea.      Mouth/Throat: Mucous membranes are moist.      Neck: No stridor. Cardiovascular: Normal rate, regular rhythm. No murmurs, rubs, or gallops. Respiratory: Normal respiratory effort without tachypnea nor retractions. Breath sounds are clear and equal bilaterally. No wheezes/rales/rhonchi. Gastrointestinal: Soft and nontender. Normal bowel sounds Musculoskeletal: Nontender with normal range of motion in extremities. No lower extremity tenderness nor edema. Neurologic:  Normal speech and language. No gross focal neurologic deficits are appreciated.  Skin:  Skin is warm, dry and intact. No rash noted. Psychiatric: Mood and affect are normal. Speech and behavior are normal.  ____________________________________________  EKG: Interpreted by me.  Sinus rhythm the rate 89 bpm, normal PR interval, normal QRS, normal QT, normal axis  ____________________________________________  ED COURSE:  As part of my medical decision making, I reviewed the following data within the [place] History obtained from family if available, nursing notes, old chart and ekg, as well as notes from prior ED visits. Patient presented for chest pain and alcohol intoxication, we will assess with labs and imaging as indicated at this  time.   Procedures ____________________________________________   LABS (pertinent positives/negatives)  Labs Reviewed  BASIC METABOLIC PANEL - Abnormal; Notable for the following components:      Result Value   Sodium 125 (*)    Chloride 94 (*)    CO2 18 (*)    All other components within normal limits  CBC - Abnormal; Notable for the following components:   WBC 11.2 (*)    RBC 3.91 (*)    Hemoglobin 12.3 (*)    HCT 37.1 (*)    All other  components within normal limits  ETHANOL - Abnormal; Notable for the following components:   Alcohol, Ethyl (B) 35 (*)    All other components within normal limits  TROPONIN I  LIPASE, BLOOD    RADIOLOGY Images were viewed by me  Chest x-ray IMPRESSION: Low lung volumes with bibasilar atelectasis. ____________________________________________   DIFFERENTIAL DIAGNOSIS   GERD, pancreatitis, MI, unstable angina, musculoskeletal pain, anxiety  FINAL ASSESSMENT AND PLAN  Chest pain, alcohol intoxication   Plan: The patient had presented for chest pain and alcohol intoxication. Patient's labs not reveal any acute process other than hyponatremia which appears somewhat chronic. Patient's imaging did not reveal any acute process.  He is cleared for outpatient follow-up.   Laurence Aly, MD    Note: This note was generated in part or whole with voice recognition software. Voice recognition is usually quite accurate but there are transcription errors that can and very often do occur. I apologize for any typographical errors that were not detected and corrected.     Earleen Newport, MD 10/04/18 2258

## 2018-10-04 NOTE — ED Notes (Signed)
Pt states CP at sternum/L/R side.

## 2018-10-04 NOTE — ED Notes (Signed)
Urine sample sent to lab

## 2018-10-04 NOTE — ED Notes (Signed)
EMS line not pulling well but was able to get a grn/lav/red off it. Sent to lab.

## 2018-10-04 NOTE — ED Triage Notes (Signed)
Pt here from home via ems , with report of chest pain / sob/ as well has etoh daily . 20g placed to RW PER EMS

## 2019-01-07 ENCOUNTER — Emergency Department: Payer: Medicare Other

## 2019-01-07 ENCOUNTER — Other Ambulatory Visit: Payer: Self-pay

## 2019-01-07 ENCOUNTER — Emergency Department
Admission: EM | Admit: 2019-01-07 | Discharge: 2019-01-07 | Disposition: A | Discharge status: Home / Self Care | Payer: Medicare Other | Attending: Student in an Organized Health Care Education/Training Program | Admitting: Student in an Organized Health Care Education/Training Program

## 2019-01-07 DIAGNOSIS — Y929 Unspecified place or not applicable: Secondary | ICD-10-CM | POA: Diagnosis not present

## 2019-01-07 DIAGNOSIS — S065XAA Traumatic subdural hemorrhage with loss of consciousness status unknown, initial encounter: Secondary | ICD-10-CM

## 2019-01-07 DIAGNOSIS — S0990XA Unspecified injury of head, initial encounter: Secondary | ICD-10-CM | POA: Diagnosis present

## 2019-01-07 DIAGNOSIS — Z79899 Other long term (current) drug therapy: Secondary | ICD-10-CM | POA: Diagnosis not present

## 2019-01-07 DIAGNOSIS — Y939 Activity, unspecified: Secondary | ICD-10-CM | POA: Diagnosis not present

## 2019-01-07 DIAGNOSIS — S5002XA Contusion of left elbow, initial encounter: Secondary | ICD-10-CM | POA: Diagnosis not present

## 2019-01-07 DIAGNOSIS — S065X0A Traumatic subdural hemorrhage without loss of consciousness, initial encounter: Secondary | ICD-10-CM | POA: Diagnosis not present

## 2019-01-07 DIAGNOSIS — Y999 Unspecified external cause status: Secondary | ICD-10-CM | POA: Diagnosis not present

## 2019-01-07 DIAGNOSIS — W1830XA Fall on same level, unspecified, initial encounter: Secondary | ICD-10-CM | POA: Diagnosis not present

## 2019-01-07 DIAGNOSIS — S065X9A Traumatic subdural hemorrhage with loss of consciousness of unspecified duration, initial encounter: Secondary | ICD-10-CM

## 2019-01-07 DIAGNOSIS — S59902A Unspecified injury of left elbow, initial encounter: Secondary | ICD-10-CM | POA: Diagnosis not present

## 2019-01-07 DIAGNOSIS — R296 Repeated falls: Secondary | ICD-10-CM | POA: Diagnosis not present

## 2019-01-07 DIAGNOSIS — I10 Essential (primary) hypertension: Secondary | ICD-10-CM | POA: Insufficient documentation

## 2019-01-07 DIAGNOSIS — R55 Syncope and collapse: Secondary | ICD-10-CM | POA: Diagnosis not present

## 2019-01-07 DIAGNOSIS — M25522 Pain in left elbow: Secondary | ICD-10-CM | POA: Diagnosis not present

## 2019-01-07 DIAGNOSIS — M542 Cervicalgia: Secondary | ICD-10-CM | POA: Diagnosis not present

## 2019-01-07 DIAGNOSIS — R531 Weakness: Secondary | ICD-10-CM | POA: Diagnosis not present

## 2019-01-07 LAB — COMPREHENSIVE METABOLIC PANEL
ALT: 13 U/L (ref 0–44)
AST: 23 U/L (ref 15–41)
Albumin: 3.9 g/dL (ref 3.5–5.0)
Alkaline Phosphatase: 63 U/L (ref 38–126)
Anion gap: 9 (ref 5–15)
BUN: 17 mg/dL (ref 8–23)
CO2: 23 mmol/L (ref 22–32)
Calcium: 9.1 mg/dL (ref 8.9–10.3)
Chloride: 97 mmol/L — ABNORMAL LOW (ref 98–111)
Creatinine, Ser: 1.1 mg/dL (ref 0.61–1.24)
GFR calc Af Amer: >60 mL/min (ref >60)
GFR calc non Af Amer: >60 mL/min (ref >60)
Glucose, Bld: 123 mg/dL — ABNORMAL HIGH (ref 70–99)
Potassium: 4.5 mmol/L (ref 3.5–5.1)
Sodium: 129 mmol/L — ABNORMAL LOW (ref 135–145)
Total Bilirubin: 1 mg/dL (ref 0.3–1.2)
Total Protein: 7.1 g/dL (ref 6.5–8.1)

## 2019-01-07 LAB — CBC WITH DIFFERENTIAL/PLATELET
Abs Immature Granulocytes: 0.02 10*3/uL (ref 0.00–0.07)
Basophils Absolute: 0 10*3/uL (ref 0.0–0.1)
Basophils Relative: 0 %
Eosinophils Absolute: 0.1 10*3/uL (ref 0.0–0.5)
Eosinophils Relative: 1 %
HCT: 31.7 % — ABNORMAL LOW (ref 39.0–52.0)
Hemoglobin: 10.8 g/dL — ABNORMAL LOW (ref 13.0–17.0)
Immature Granulocytes: 0 %
Lymphocytes Relative: 10 %
Lymphs Abs: 0.7 10*3/uL (ref 0.7–4.0)
MCH: 31 pg (ref 26.0–34.0)
MCHC: 34.1 g/dL (ref 30.0–36.0)
MCV: 91.1 fL (ref 80.0–100.0)
Monocytes Absolute: 0.6 10*3/uL (ref 0.1–1.0)
Monocytes Relative: 9 %
Neutro Abs: 5.5 10*3/uL (ref 1.7–7.7)
Neutrophils Relative %: 80 %
Platelets: 246 10*3/uL (ref 150–400)
RBC: 3.48 MIL/uL — ABNORMAL LOW (ref 4.22–5.81)
RDW: 13.2 % (ref 11.5–15.5)
WBC: 6.9 10*3/uL (ref 4.0–10.5)
nRBC: 0 % (ref 0.0–0.2)

## 2019-01-07 LAB — URINALYSIS, COMPLETE (UACMP) WITH MICROSCOPIC
Bacteria, UA: NONE SEEN
Bilirubin Urine: NEGATIVE
Glucose, UA: NEGATIVE mg/dL
Hgb urine dipstick: NEGATIVE
Ketones, ur: 5 mg/dL — AB
Leukocytes,Ua: NEGATIVE
Nitrite: NEGATIVE
Protein, ur: 30 mg/dL — AB
Specific Gravity, Urine: 1.015 (ref 1.005–1.030)
pH: 6 (ref 5.0–8.0)

## 2019-01-07 LAB — ETHANOL: Alcohol, Ethyl (B): <10 mg/dL (ref <10)

## 2019-01-07 LAB — LIPASE, BLOOD: Lipase: 44 U/L (ref 11–51)

## 2019-01-07 LAB — TROPONIN I: Troponin I: <0.03 ng/mL (ref <0.03)

## 2019-01-07 MED ORDER — VITAMIN B-1 100 MG PO TABS: 100.0000 mg | ORAL_TABLET | Freq: Every day | ORAL | Status: DC

## 2019-01-07 MED ORDER — LORAZEPAM 2 MG PO TABS: 0.0000 mg | ORAL_TABLET | Freq: Two times a day (BID) | ORAL | Status: DC

## 2019-01-07 MED ORDER — LORAZEPAM 2 MG/ML IJ SOLN: 0.0000 mg | Freq: Two times a day (BID) | INTRAMUSCULAR | Status: DC

## 2019-01-07 MED ORDER — VITAMIN B-1 100 MG PO TABS
100.0000 mg | ORAL_TABLET | Freq: Once | ORAL | Status: AC
  Administered 2019-01-07: 100 mg via ORAL
  Filled 2019-01-07: qty 1

## 2019-01-07 MED ORDER — LORAZEPAM 2 MG/ML IJ SOLN: 0.0000 mg | Freq: Four times a day (QID) | INTRAMUSCULAR | Status: DC

## 2019-01-07 MED ORDER — SODIUM CHLORIDE 0.9 % IV BOLUS
500.0000 mL | Freq: Once | INTRAVENOUS | Status: AC
  Administered 2019-01-07: 500 mL via INTRAVENOUS

## 2019-01-07 MED ORDER — LORAZEPAM 2 MG PO TABS: 0.0000 mg | ORAL_TABLET | Freq: Four times a day (QID) | ORAL | Status: DC

## 2019-01-07 MED ORDER — THIAMINE HCL 100 MG/ML IJ SOLN: 100.0000 mg | Freq: Every day | INTRAMUSCULAR | Status: DC

## 2019-01-07 NOTE — Consult Note (Signed)
Neurosurgery-New Consultation Evaluation 01/07/2019 Joshua Lynn [id number]  Identifying Statement: Joshua Lynn is a 78 y.o. male from [place] [address] with recent fall  Physician Requesting Consultation: [facility] ED  History of Present Illness: Joshua Lynn is here for evaluation after a fall. He states he hit the back of his head. He did have a headache but states this has improved. He denies any other changes including speech, weakness, or numbness. He doesn't report many falls in the past. He does not take any blood thinners. He has been alert since his arrival but was confused. CT of the head was obtained.   Past Medical History:  Past Medical History:  Diagnosis Date  . Hypertension     Social History: Social History   Socioeconomic History  . Marital status: Widowed    Spouse name: Not on file  . Number of children: Not on file  . Years of education: Not on file  . Highest education level: Not on file  Occupational History  . Not on file  Social Needs  . Financial resource strain: Not on file  . Food insecurity:    Worry: Not on file    Inability: Not on file  . Transportation needs:    Medical: Not on file    Non-medical: Not on file  Tobacco Use  . Smoking status: Never Smoker  . Smokeless tobacco: Never Used  Substance and Sexual Activity  . Alcohol use: Yes  . Drug use: Never  . Sexual activity: Not Currently  Lifestyle  . Physical activity:    Days per week: Not on file    Minutes per session: Not on file  . Stress: Not on file  Relationships  . Social connections:    Talks on phone: Not on file    Gets together: Not on file    Attends religious service: Not on file    Active member of club or organization: Not on file    Attends meetings of clubs or organizations: Not on file    Relationship status: Not on file  . Intimate partner violence:    Fear of current or ex partner: Not on file    Emotionally abused: Not on file   Physically abused: Not on file    Forced sexual activity: Not on file  Other Topics Concern  . Not on file  Social History Narrative  . Not on file   Family History: History reviewed. No pertinent family history.  Review of Systems:  Review of Systems - General ROS: Negative Psychological ROS: Negative Ophthalmic ROS: Negative ENT ROS: Negative Hematological and Lymphatic ROS: Negative  Endocrine ROS: Negative Respiratory ROS: Negative Cardiovascular ROS: Negative Gastrointestinal ROS: Negative Genito-Urinary ROS: Negative Musculoskeletal ROS: Negative Neurological ROS: Negative for headache Dermatological ROS: Negative  Physical Exam: BP (!) 163/83 (BP Location: Right Arm)   Pulse 84   Temp 99.1 F (37.3 C) (Oral)   Resp 18   Ht 5\' 7"  (1.702 m)   Wt 79.1 kg   SpO2 100%   BMI 27.31 kg/m  Body mass index is 27.31 kg/m. Body surface area is 1.93 meters squared. General appearance: Alert, cooperative, in no acute distress Head: Normocephalic, atraumatic Eyes: Normal, EOM intact Oropharynx: Moist without lesions Neck: Supple, no tenderness Heart: Normal, regular rate and rhythm, without murmur Lungs: Clear to auscultation, good air exchange Abdomen: Soft, nondistended Ext: No edema in LE bilaterally, good distal pulses  Neurologic exam:  Mental status: alertness: alert, orientation: person,  place, time, affect: normal Speech: fluent and clear, naming and repetition intact Cranial nerves:  II: Visual fields are full by confrontation, no ptosis III/IV/VI: extra-ocular motions intact bilaterally V/VII:no evidence of facial droop or weakness  VIII: hearing normal XII: tongue strength symmetric  Motor:strength symmetric 5/5, normal muscle mass and tone in all extremities and no pronator drift Sensory: intact to light touch in all extremities Gait: not tested   Imaging: CT Head: 1. Chronic left-sided subdural hematoma/hygroma with small focus of new/acute  hemorrhage/hematoma in the left frontal area.2. Mild likely chronic mass effect on the left sulci and left lateral ventricle. No subfalcine herniation.3. No findings for hemispheric infarction or mass lesion. 4. No acute skull fracture.   Impression/Plan:  Mr. Joshua Lynn is here for a fall and finding of a chronic SDH with small amount of hyperdensity. He has no obvious deficits on his exam now and I don't see a need for intervention. I do recommend a repeat CT to ensure stability. If this is stable, we can follow up in 2 weeks as an outpatient.    1.  Diagnosis: Chronic Subdural Hematoma  2.  Plan - Repeat CT Head in 6 hours, follow up as outpatient if stable

## 2019-01-07 NOTE — ED Notes (Addendum)
Patient and ED tech went to CT scan.

## 2019-01-07 NOTE — Progress Notes (Signed)
PT Cancellation Note  Patient Details Name: Joshua Lynn MRN: [id number] DOB: 03-17-1941   Cancelled Treatment:    Reason Eval/Treat Not Completed: Other (comment). Consult received and chart reviewed. Per protocol, will wait for repeat head CT results prior to initiating PT eval secondary to possible acute L frontal hematoma. Will hold at this time.   Omar Orrego 01/07/2019, 2:23 PM  Greggory Stallion, PT, DPT [phone]

## 2019-01-07 NOTE — TOC Initial Note (Signed)
Transition of Care [facility]) - Initial/Assessment Note    Patient Details  Name: Joshua Lynn MRN: [id number] Date of Birth: 11-Sep-1940  [facility] CM/SW Contact:    Fredric Mare, LCSW Phone Number: 01/07/2019, 2:16 PM  Clinical Narrative:                 Patient is a 78 year old male who presents to the ED for weakness and fall. Patients states that this is the second time he has fallen, and was "only feeling dizzy." Patient shared that he currently lives with his father in law, Joshua Lynn. Patient states that he would not like to go to a SNF if that is recommended by PT, and would rather utilize home health services. Patient denied needing any DME.  Patient stated that he "feels great" is alert and oriented. CSW informed patient that we will wait for PT recommendations to continue working on discharge plans. Patient understood.    Expected Discharge Plan: [place] Barriers to Discharge: Continued Medical Work up   Patient Goals and CMS Choice        Expected Discharge Plan and Services Expected Discharge Plan: [place]   Discharge Planning Services: CM Consult   Living arrangements for the past 2 months: Single Family Home                                      Prior Living Arrangements/Services Living arrangements for the past 2 months: Single Family Home Lives with:: Other (Comment)(pt lives with his father in [place] ) Patient language and need for interpreter reviewed:: Yes Do you feel safe going back to the place where you live?: Yes      Need for Family Participation in Patient Care: No (Comment) Care giver support system in place?: No (comment)   Criminal Activity/Legal Involvement Pertinent to Current Situation/Hospitalization: No - Comment as needed  Activities of Daily Living      Permission Sought/Granted Permission sought to share information with : Family Supports    Share Information with NAME:  Joshua Lynn      Permission granted to share info w Relationship: father in law   Permission granted to share info w Contact Information: [phone]  Emotional Assessment Appearance:: Appears stated age Attitude/Demeanor/Rapport: Engaged Affect (typically observed): Accepting Orientation: : Oriented to Self, Oriented to Situation, Oriented to Place, Oriented to  Time Alcohol / Substance Use: Alcohol Use Psych Involvement: No (comment)  Admission diagnosis:  weakness ems There are no active problems to display for this patient.  PCP:  System, Pcp Not In Pharmacy:  No Pharmacies Listed    Social Determinants of Health (SDOH) Interventions    Readmission Risk Interventions No flowsheet data found.

## 2019-01-07 NOTE — Discharge Instructions (Signed)
Home health physical therapy has been ordered to come assist you at home.

## 2019-01-07 NOTE — ED Notes (Signed)
CSW at bedside.

## 2019-01-07 NOTE — TOC Transition Note (Signed)
Transition of Care [facility]) - CM/SW Discharge Note   Patient Details  Name: Joshua Lynn MRN: [id number] Date of Birth: 1940/10/20  [facility] CM/SW Contact:  Fredric Mare, LCSW Phone Number: 01/07/2019, 5:36 PM   Clinical Narrative:    Patient has agreed to home health PT. Patient did not have a preference of home health agency.  CSW contacted Floydene Flock at [facility], and notified him of the patient.  CSW asked EDP to order face to face and home health for PT.   TOC CSW signing off.    Final next level of care: [place] Barriers to Discharge: Continued Medical Work up   Patient Goals and CMS Choice     Choice offered to / list presented to : Patient  Discharge Placement                       Discharge Plan and Services   Discharge Planning Services: CM Consult                      HH Arranged: PT [facility] Agency: [place] ([facility]) Date HH Agency Contacted: 01/07/19 Time HH Agency Contacted: [phone]    Social Determinants of Health (SDOH) Interventions     Readmission Risk Interventions No flowsheet data found.

## 2019-01-07 NOTE — ED Notes (Signed)
Pt anxious ambulating around room stating "I really need to get home. I have animals to feed and I am the only one who feeds them." Reminded pt that the MD wanted to repeat the CT on his head. Pt states "I believe they are playing fuck around and not worried about getting this done." Assured pt that they did not forget about him and that they will get to him as soon as they can. Pt remains frustrated.

## 2019-01-07 NOTE — ED Notes (Signed)
Pt tried to urinate to collect sample but was unsuccessful at this time. Will try again in 30 minutes.

## 2019-01-07 NOTE — ED Notes (Signed)
Telesitter and ED tech [place] at bedside. Patient is up and down from a recliner. Explanation was given to patient as to why he needs to stay for the CT scan at 1900. Patient agreed to leave the IV in place. Patient told ED tech that he needed to go home to feed his animals.

## 2019-01-07 NOTE — ED Notes (Signed)
Sitting 1:1 with pt. 

## 2019-01-07 NOTE — ED Notes (Signed)
This nurse found the patient wandering down the hall. He was alert and stated he wanted to go home and was looking for his doctor. Pt escorted back to room. Pt had removed all leads, and disconnected his IV. Discussed necessity of staying to receive complete care and ensure he was in the best possible condition to return to his home. Pt agreed to stay for a little while but stated he wanted to go home as soon as possible. Dr Quentin Cornwall notified.

## 2019-01-07 NOTE — ED Notes (Addendum)
Patient is back from CT scan. [facility] ED tech at bedside.

## 2019-01-07 NOTE — ED Notes (Signed)
Report given to Tom RN.

## 2019-01-07 NOTE — ED Provider Notes (Signed)
[facility] Emergency Department Provider Note    First MD Initiated Contact with Patient 01/07/19 1207     (approximate)  I have reviewed the triage vital signs and the nursing notes.   HISTORY  Chief Complaint Weakness and Fall    HPI Joshua Lynn is a 78 y.o. male history hypertension as well as admitted alcohol dependence and abuse presents the ER for difficulty walking.  According to family patient has been drinking since 2 AM.  Has had multiple falls over the past several days one time hitting the back of his head and subsequently developing neck pain.  Also has some pain in his left elbow after 1 of these falls.  States he drinks daily.  Denies any nausea vomiting diarrhea.  He is a somewhat poor historian.  Very hard of hearing.   Past Medical History:  Diagnosis Date  . Hypertension    History reviewed. No pertinent family history. History reviewed. No pertinent surgical history. There are no active problems to display for this patient.     Prior to Admission medications   Medication Sig Start Date End Date Taking? Authorizing Provider  predniSONE (DELTASONE) 20 MG tablet Take 2 tablets (40 mg total) by mouth daily. 06/18/18   Harvest Dark, MD    Allergies Patient has no allergy information on record.    Social History Social History   Tobacco Use  . Smoking status: Never Smoker  . Smokeless tobacco: Never Used  Substance Use Topics  . Alcohol use: Yes  . Drug use: Never    Review of Systems Patient denies headaches, rhinorrhea, blurry vision, numbness, shortness of breath, chest pain, edema, cough, abdominal pain, nausea, vomiting, diarrhea, dysuria, fevers, rashes or hallucinations unless otherwise stated above in HPI. ____________________________________________   PHYSICAL EXAM:  VITAL SIGNS: Vitals:   01/07/19 1522 01/07/19 1841  BP: (!) 147/91 133/88  Pulse: 84 91  Resp:  20  Temp:    SpO2:  100%     Constitutional: Alert, disheveled, oriented x 3 Eyes: Conjunctivae are normal.  Head: small contusion to right parietal scalp Nose: No congestion/rhinnorhea. Mouth/Throat: Mucous membranes are moist.   Neck: No stridor. ttp in low c spine no step offs Cardiovascular: Normal rate, regular rhythm. Grossly normal heart sounds.  Good peripheral circulation. Respiratory: Normal respiratory effort.  No retractions. Lungs CTAB. Gastrointestinal: Soft and nontender. No distention. No abdominal bruits. No CVA tenderness. Genitourinary: fdeferred Musculoskeletal: contusion to left elbow, no laceration. No lower extremity tenderness nor edema.  No joint effusions. Neurologic:  Slightly drowsy but oriented x 3.  Very hard of hearing No gross focal neurologic deficits are appreciated. No facial droop.  MAE spontaneously Skin:  Skin is warm, dry and intact. No rash noted. Psychiatric: Mood and affect are normal.  ____________________________________________   LABS (all labs ordered are listed, but only abnormal results are displayed)  Results for orders placed or performed during the hospital encounter of 01/07/19 (from the past 24 hour(s))  CBC with Differential/Platelet     Status: Abnormal   Collection Time: 01/07/19 12:24 PM  Result Value Ref Range   WBC 6.9 4.0 - 10.5 K/uL   RBC 3.48 (L) 4.22 - 5.81 MIL/uL   Hemoglobin 10.8 (L) 13.0 - 17.0 g/dL   HCT 31.7 (L) 39.0 - 52.0 %   MCV 91.1 80.0 - 100.0 fL   MCH 31.0 26.0 - 34.0 pg   MCHC 34.1 30.0 - 36.0 g/dL   RDW 13.2 11.5 -  15.5 %   Platelets 246 150 - 400 K/uL   nRBC 0.0 0.0 - 0.2 %   Neutrophils Relative % 80 %   Neutro Abs 5.5 1.7 - 7.7 K/uL   Lymphocytes Relative 10 %   Lymphs Abs 0.7 0.7 - 4.0 K/uL   Monocytes Relative 9 %   Monocytes Absolute 0.6 0.1 - 1.0 K/uL   Eosinophils Relative 1 %   Eosinophils Absolute 0.1 0.0 - 0.5 K/uL   Basophils Relative 0 %   Basophils Absolute 0.0 0.0 - 0.1 K/uL   Immature Granulocytes 0 %   Abs  Immature Granulocytes 0.02 0.00 - 0.07 K/uL  Comprehensive metabolic panel     Status: Abnormal   Collection Time: 01/07/19 12:24 PM  Result Value Ref Range   Sodium 129 (L) 135 - 145 mmol/L   Potassium 4.5 3.5 - 5.1 mmol/L   Chloride 97 (L) 98 - 111 mmol/L   CO2 23 22 - 32 mmol/L   Glucose, Bld 123 (H) 70 - 99 mg/dL   BUN 17 8 - 23 mg/dL   Creatinine, Ser 1.10 0.61 - 1.24 mg/dL   Calcium 9.1 8.9 - 10.3 mg/dL   Total Protein 7.1 6.5 - 8.1 g/dL   Albumin 3.9 3.5 - 5.0 g/dL   AST 23 15 - 41 U/L   ALT 13 0 - 44 U/L   Alkaline Phosphatase 63 38 - 126 U/L   Total Bilirubin 1.0 0.3 - 1.2 mg/dL   GFR calc non Af Amer >60 >60 mL/min   GFR calc Af Amer >60 >60 mL/min   Anion gap 9 5 - 15  Lipase, blood     Status: None   Collection Time: 01/07/19 12:24 PM  Result Value Ref Range   Lipase 44 11 - 51 U/L  Ethanol     Status: None   Collection Time: 01/07/19 12:24 PM  Result Value Ref Range   Alcohol, Ethyl (B) <10 <10 mg/dL  Troponin I - ONCE - STAT     Status: None   Collection Time: 01/07/19 12:30 PM  Result Value Ref Range   Troponin I <0.03 <0.03 ng/mL  Urinalysis, Complete w Microscopic     Status: Abnormal   Collection Time: 01/07/19  4:59 PM  Result Value Ref Range   Color, Urine YELLOW (A) YELLOW   APPearance HAZY (A) CLEAR   Specific Gravity, Urine 1.015 1.005 - 1.030   pH 6.0 5.0 - 8.0   Glucose, UA NEGATIVE NEGATIVE mg/dL   Hgb urine dipstick NEGATIVE NEGATIVE   Bilirubin Urine NEGATIVE NEGATIVE   Ketones, ur 5 (A) NEGATIVE mg/dL   Protein, ur 30 (A) NEGATIVE mg/dL   Nitrite NEGATIVE NEGATIVE   Leukocytes,Ua NEGATIVE NEGATIVE   RBC / HPF 0-5 0 - 5 RBC/hpf   WBC, UA 0-5 0 - 5 WBC/hpf   Bacteria, UA NONE SEEN NONE SEEN   Squamous Epithelial / LPF 0-5 0 - 5   Mucus PRESENT    Hyaline Casts, UA PRESENT    ____________________________________________  EKG My review and personal interpretation at Time: 12:23   Indication: ams  Rate: 60  Rhythm: sinus Axis: normal  Other: normal intervals, no stemi ____________________________________________  RADIOLOGY  I personally reviewed all radiographic images ordered to evaluate for the above acute complaints and reviewed radiology reports and findings.  These findings were personally discussed with the patient.  Please see medical record for radiology report.  ____________________________________________   PROCEDURES  Procedure(s) performed:  Procedures  Critical Care performed: no ____________________________________________   INITIAL IMPRESSION / ASSESSMENT AND PLAN / ED COURSE  Pertinent labs & imaging results that were available during my care of the patient were reviewed by me and considered in my medical decision making (see chart for details).   DDX: Dehydration, sepsis, pna, uti, hypoglycemia, cva, drug effect, withdrawal, encephalitis   Joshua Lynn is a 78 y.o. who presents to the ED with symptoms as described above patient nontoxic-appearing.  Afebrile hemodynamically stable and protecting his airway.  Is having a couple falls over the past few days.  Reportedly supposed to use a walker or a cane but has had some falls when he was not using his cane.  Denies any weakness.  No numbness or tingling.  Not on any blood thinners.  States he does endorse drinking alcohol.  Seems a bit drowsy right now.  CT imaging and blood work will be sent for the above differential.  Observe in the ER.  Clinical Course as of Jan 06 1922  Tue Jan 07, 2019  1323 Repeat exam unchanged.  CT imaging does show evidence of acute on chronic subdural hematoma.  Patient adamant that is not on any anticoagulants or antiplatelet.  But does admit to drinking alcohol.  Will consult neurosurgery for further recommendations.   [PR]  1336 Case Discussed in consultation with Dr. Lacinda Axon. Given lack of anticoagulation will observe in ED for repeat ct head in 6 hrs.  No indication for transfer or nsgy intervention at this time.    [PR]  1519 Patient found ambulating down the hallway.  Was redirected back to his room.  Has been evaluated by neurosurgery.  We will continue with plan for repeat CT head.  Still waiting urinalysis.  Blood work otherwise roughly at baseline.  Have consulted social work.  Consult PT.   [PR]  1740 Urinalysis is reassuring.  Patient up ambulating about the room requesting discharge.  Growing very patient.  He is oriented x3.  No focal neuro deficits.  Have encouraged patient to stay for repeat head CT.  Have patient does not want to stay for physical therapy evaluation but does agree to home health PT.  Does have family members at home.   [PR]  1915 Repeat CT as unchanged.  Patient again requesting discharge adamantly does not want to be admitted to the hospital.  Do believe he has capacity make these decisions.  No evidence of intoxication at this time.  No evidence of withdrawal.   [PR]    Clinical Course User Index [PR] Merlyn Lot, MD    The patient was evaluated in Emergency Department today for the symptoms described in the history of present illness. He/she was evaluated in the context of the global COVID-19 pandemic, which necessitated consideration that the patient might be at risk for infection with the SARS-CoV-2 virus that causes COVID-19. Institutional protocols and algorithms that pertain to the evaluation of patients at risk for COVID-19 are in a state of rapid change based on information released by regulatory bodies including the CDC and federal and state organizations. These policies and algorithms were followed during the patient's care in the ED.  As part of my medical decision making, I reviewed the following data within the Early notes reviewed and incorporated, Labs reviewed, notes from prior ED visits and [place] Controlled Substance Database   ____________________________________________   FINAL CLINICAL IMPRESSION(S) / ED DIAGNOSES  Final  diagnoses:  SDH (subdural hematoma) ([place])  Frequent falls      NEW MEDICATIONS STARTED DURING THIS VISIT:  New Prescriptions   No medications on file     Note:  This document was prepared using Dragon voice recognition software and may include unintentional dictation errors.    Merlyn Lot, MD 01/07/19 [id number]

## 2019-01-07 NOTE — ED Triage Notes (Addendum)
Pt arrives to ED from home via [facility] with c/c of generalized weakness and possible fall. EMS reports that pts family was concerned because he was drinking since 2 AM and they found him on the floor unable to stand. Pt has reported Hx of chronic drinking. Transport vitals reported as initial BP of 80/60 which increased to 100/70 after 200cc NS. Pulse 72, O2 sat 98%. Upon arrival, Pt alert and oriented to self but not date or situation.. Pt complaining of pain to left elbow stating he fell on it 4 days ago. Pt in no acute distress, no respiratory distress evident. At end of triage interview, pt states he has a knot on the back of his head from where he fell several days ago and his neck hurts.

## 2019-01-08 ENCOUNTER — Telehealth: Payer: Self-pay

## 2019-01-08 NOTE — Telephone Encounter (Signed)
CSW called patient in order to verify patient's PCP to set up home health services. Pt shared that his PCP is Dr. Oran Rein from the [facility].

## 2019-01-14 ENCOUNTER — Other Ambulatory Visit: Payer: Self-pay

## 2019-01-14 ENCOUNTER — Inpatient Hospital Stay
Admission: EM | Admit: 2019-01-14 | Discharge: 2019-01-19 | DRG: 956 | Disposition: A | Discharge status: Other Acute Inpatient Hospital | Payer: Medicare Other | Attending: Internal Medicine | Admitting: Internal Medicine

## 2019-01-14 ENCOUNTER — Emergency Department: Payer: Medicare Other

## 2019-01-14 ENCOUNTER — Encounter: Payer: Self-pay | Admitting: Emergency Medicine

## 2019-01-14 DIAGNOSIS — S299XXA Unspecified injury of thorax, initial encounter: Secondary | ICD-10-CM | POA: Diagnosis not present

## 2019-01-14 DIAGNOSIS — S72041A Displaced fracture of base of neck of right femur, initial encounter for closed fracture: Secondary | ICD-10-CM | POA: Diagnosis not present

## 2019-01-14 DIAGNOSIS — S72001A Fracture of unspecified part of neck of right femur, initial encounter for closed fracture: Principal | ICD-10-CM | POA: Diagnosis present

## 2019-01-14 DIAGNOSIS — Z96641 Presence of right artificial hip joint: Secondary | ICD-10-CM | POA: Diagnosis not present

## 2019-01-14 DIAGNOSIS — Z20828 Contact with and (suspected) exposure to other viral communicable diseases: Secondary | ICD-10-CM | POA: Diagnosis present

## 2019-01-14 DIAGNOSIS — F10239 Alcohol dependence with withdrawal, unspecified: Secondary | ICD-10-CM | POA: Diagnosis not present

## 2019-01-14 DIAGNOSIS — Z471 Aftercare following joint replacement surgery: Secondary | ICD-10-CM | POA: Diagnosis not present

## 2019-01-14 DIAGNOSIS — W19XXXA Unspecified fall, initial encounter: Secondary | ICD-10-CM | POA: Diagnosis present

## 2019-01-14 DIAGNOSIS — Z9001 Acquired absence of eye: Secondary | ICD-10-CM

## 2019-01-14 DIAGNOSIS — Z7952 Long term (current) use of systemic steroids: Secondary | ICD-10-CM | POA: Diagnosis not present

## 2019-01-14 DIAGNOSIS — R4182 Altered mental status, unspecified: Secondary | ICD-10-CM | POA: Diagnosis not present

## 2019-01-14 DIAGNOSIS — E871 Hypo-osmolality and hyponatremia: Secondary | ICD-10-CM | POA: Diagnosis present

## 2019-01-14 DIAGNOSIS — S065X9A Traumatic subdural hemorrhage with loss of consciousness of unspecified duration, initial encounter: Secondary | ICD-10-CM | POA: Diagnosis present

## 2019-01-14 DIAGNOSIS — F419 Anxiety disorder, unspecified: Secondary | ICD-10-CM | POA: Diagnosis present

## 2019-01-14 DIAGNOSIS — Z79899 Other long term (current) drug therapy: Secondary | ICD-10-CM

## 2019-01-14 DIAGNOSIS — S7291XA Unspecified fracture of right femur, initial encounter for closed fracture: Secondary | ICD-10-CM | POA: Diagnosis not present

## 2019-01-14 DIAGNOSIS — G8918 Other acute postprocedural pain: Secondary | ICD-10-CM

## 2019-01-14 DIAGNOSIS — Z03818 Encounter for observation for suspected exposure to other biological agents ruled out: Secondary | ICD-10-CM | POA: Diagnosis not present

## 2019-01-14 DIAGNOSIS — R441 Visual hallucinations: Secondary | ICD-10-CM | POA: Diagnosis not present

## 2019-01-14 DIAGNOSIS — I6201 Nontraumatic acute subdural hemorrhage: Secondary | ICD-10-CM | POA: Diagnosis not present

## 2019-01-14 DIAGNOSIS — S72031A Displaced midcervical fracture of right femur, initial encounter for closed fracture: Secondary | ICD-10-CM | POA: Diagnosis not present

## 2019-01-14 DIAGNOSIS — I10 Essential (primary) hypertension: Secondary | ICD-10-CM | POA: Diagnosis present

## 2019-01-14 DIAGNOSIS — D62 Acute posthemorrhagic anemia: Secondary | ICD-10-CM | POA: Diagnosis not present

## 2019-01-14 DIAGNOSIS — F1010 Alcohol abuse, uncomplicated: Secondary | ICD-10-CM | POA: Diagnosis present

## 2019-01-14 DIAGNOSIS — R509 Fever, unspecified: Secondary | ICD-10-CM | POA: Diagnosis not present

## 2019-01-14 DIAGNOSIS — I6200 Nontraumatic subdural hemorrhage, unspecified: Secondary | ICD-10-CM | POA: Diagnosis not present

## 2019-01-14 DIAGNOSIS — S065X1A Traumatic subdural hemorrhage with loss of consciousness of 30 minutes or less, initial encounter: Secondary | ICD-10-CM | POA: Diagnosis not present

## 2019-01-14 DIAGNOSIS — K219 Gastro-esophageal reflux disease without esophagitis: Secondary | ICD-10-CM | POA: Insufficient documentation

## 2019-01-14 DIAGNOSIS — D181 Lymphangioma, any site: Secondary | ICD-10-CM | POA: Diagnosis not present

## 2019-01-14 DIAGNOSIS — Z419 Encounter for procedure for purposes other than remedying health state, unspecified: Secondary | ICD-10-CM

## 2019-01-14 DIAGNOSIS — I6203 Nontraumatic chronic subdural hemorrhage: Secondary | ICD-10-CM | POA: Diagnosis not present

## 2019-01-14 HISTORY — DX: Anxiety disorder, unspecified: F41.9

## 2019-01-14 HISTORY — DX: Gastro-esophageal reflux disease without esophagitis: K21.9

## 2019-01-14 HISTORY — DX: Other problems related to lifestyle: Z72.89

## 2019-01-14 HISTORY — DX: Other specified health status: Z78.9

## 2019-01-14 LAB — BASIC METABOLIC PANEL
Anion gap: 14 (ref 5–15)
BUN: 15 mg/dL (ref 8–23)
CO2: 20 mmol/L — ABNORMAL LOW (ref 22–32)
Calcium: 9.3 mg/dL (ref 8.9–10.3)
Chloride: 96 mmol/L — ABNORMAL LOW (ref 98–111)
Creatinine, Ser: 0.89 mg/dL (ref 0.61–1.24)
GFR calc Af Amer: >60 mL/min (ref >60)
GFR calc non Af Amer: >60 mL/min (ref >60)
Glucose, Bld: 132 mg/dL — ABNORMAL HIGH (ref 70–99)
Potassium: 4 mmol/L (ref 3.5–5.1)
Sodium: 130 mmol/L — ABNORMAL LOW (ref 135–145)

## 2019-01-14 LAB — CBC WITH DIFFERENTIAL/PLATELET
Abs Immature Granulocytes: 0.04 10*3/uL (ref 0.00–0.07)
Basophils Absolute: 0 10*3/uL (ref 0.0–0.1)
Basophils Relative: 0 %
Eosinophils Absolute: 0 10*3/uL (ref 0.0–0.5)
Eosinophils Relative: 0 %
HCT: 36.1 % — ABNORMAL LOW (ref 39.0–52.0)
Hemoglobin: 12.5 g/dL — ABNORMAL LOW (ref 13.0–17.0)
Immature Granulocytes: 0 %
Lymphocytes Relative: 3 %
Lymphs Abs: 0.4 10*3/uL — ABNORMAL LOW (ref 0.7–4.0)
MCH: 31.6 pg (ref 26.0–34.0)
MCHC: 34.6 g/dL (ref 30.0–36.0)
MCV: 91.2 fL (ref 80.0–100.0)
Monocytes Absolute: 0.5 10*3/uL (ref 0.1–1.0)
Monocytes Relative: 5 %
Neutro Abs: 9.8 10*3/uL — ABNORMAL HIGH (ref 1.7–7.7)
Neutrophils Relative %: 92 %
Platelets: 266 10*3/uL (ref 150–400)
RBC: 3.96 MIL/uL — ABNORMAL LOW (ref 4.22–5.81)
RDW: 13 % (ref 11.5–15.5)
WBC: 10.7 10*3/uL — ABNORMAL HIGH (ref 4.0–10.5)
nRBC: 0 % (ref 0.0–0.2)

## 2019-01-14 LAB — PROTIME-INR
INR: 1 (ref 0.8–1.2)
Prothrombin Time: 13.1 seconds (ref 11.4–15.2)

## 2019-01-14 LAB — SARS CORONAVIRUS 2 BY RT PCR (HOSPITAL ORDER, PERFORMED IN ~~LOC~~ HOSPITAL LAB): SARS Coronavirus 2: NEGATIVE

## 2019-01-14 MED ORDER — OXYCODONE HCL 5 MG PO TABS
5.0000 mg | ORAL_TABLET | ORAL | Status: DC | PRN
  Administered 2019-01-14: 5 mg via ORAL
  Filled 2019-01-14: qty 1

## 2019-01-14 MED ORDER — ACETAMINOPHEN 650 MG RE SUPP: 650.0000 mg | Freq: Four times a day (QID) | RECTAL | Status: DC | PRN

## 2019-01-14 MED ORDER — ACETAMINOPHEN 325 MG PO TABS
650.0000 mg | ORAL_TABLET | Freq: Four times a day (QID) | ORAL | Status: DC | PRN
  Administered 2019-01-16: 01:00:00 650 mg via ORAL
  Filled 2019-01-14: qty 2

## 2019-01-14 MED ORDER — LABETALOL HCL 5 MG/ML IV SOLN: 10.0000 mg | INTRAVENOUS | Status: DC | PRN

## 2019-01-14 MED ORDER — FOLIC ACID 1 MG PO TABS
1.0000 mg | ORAL_TABLET | Freq: Every day | ORAL | Status: DC
  Administered 2019-01-17 – 2019-01-19 (×3): 1 mg via ORAL
  Filled 2019-01-14 (×3): qty 1

## 2019-01-14 MED ORDER — LORAZEPAM 2 MG/ML IJ SOLN
0.0000 mg | Freq: Two times a day (BID) | INTRAMUSCULAR | Status: AC
  Administered 2019-01-17 – 2019-01-18 (×2): 2 mg via INTRAVENOUS
  Filled 2019-01-14 (×2): qty 1

## 2019-01-14 MED ORDER — CEFAZOLIN SODIUM-DEXTROSE 2-4 GM/100ML-% IV SOLN
2.0000 g | Freq: Once | INTRAVENOUS | Status: DC
  Filled 2019-01-14: qty 100

## 2019-01-14 MED ORDER — LORAZEPAM 1 MG PO TABS
1.0000 mg | ORAL_TABLET | Freq: Four times a day (QID) | ORAL | Status: AC | PRN
  Administered 2019-01-17: 05:00:00 1 mg via ORAL
  Filled 2019-01-14: qty 1

## 2019-01-14 MED ORDER — ADULT MULTIVITAMIN W/MINERALS CH
1.0000 | ORAL_TABLET | Freq: Every day | ORAL | Status: DC
  Administered 2019-01-18 – 2019-01-19 (×2): 1 via ORAL
  Filled 2019-01-14 (×3): qty 1

## 2019-01-14 MED ORDER — ONDANSETRON HCL 4 MG PO TABS: 4.0000 mg | ORAL_TABLET | Freq: Four times a day (QID) | ORAL | Status: DC | PRN

## 2019-01-14 MED ORDER — THIAMINE HCL 100 MG/ML IJ SOLN: 100.0000 mg | Freq: Every day | INTRAMUSCULAR | Status: DC

## 2019-01-14 MED ORDER — MORPHINE SULFATE (PF) 2 MG/ML IV SOLN
2.0000 mg | INTRAVENOUS | Status: DC | PRN
  Administered 2019-01-15 – 2019-01-16 (×5): 2 mg via INTRAVENOUS
  Filled 2019-01-14 (×5): qty 1

## 2019-01-14 MED ORDER — MORPHINE SULFATE (PF) 4 MG/ML IV SOLN
4.0000 mg | Freq: Once | INTRAVENOUS | Status: AC
  Administered 2019-01-14: 4 mg via INTRAVENOUS
  Filled 2019-01-14: qty 1

## 2019-01-14 MED ORDER — ONDANSETRON HCL 4 MG/2ML IJ SOLN: 4.0000 mg | Freq: Four times a day (QID) | INTRAMUSCULAR | Status: DC | PRN

## 2019-01-14 MED ORDER — VITAMIN B-1 100 MG PO TABS
100.0000 mg | ORAL_TABLET | Freq: Every day | ORAL | Status: DC
  Administered 2019-01-18 – 2019-01-19 (×2): 100 mg via ORAL
  Filled 2019-01-14 (×3): qty 1

## 2019-01-14 MED ORDER — SODIUM CHLORIDE 0.9 % IV BOLUS
1000.0000 mL | Freq: Once | INTRAVENOUS | Status: AC
  Administered 2019-01-14: 21:00:00 1000 mL via INTRAVENOUS

## 2019-01-14 MED ORDER — LORAZEPAM 2 MG/ML IJ SOLN
1.0000 mg | Freq: Four times a day (QID) | INTRAMUSCULAR | Status: AC | PRN
  Administered 2019-01-15 (×2): 1 mg via INTRAVENOUS

## 2019-01-14 MED ORDER — LORAZEPAM 2 MG/ML IJ SOLN
0.0000 mg | Freq: Four times a day (QID) | INTRAMUSCULAR | Status: AC
  Filled 2019-01-14 (×2): qty 1

## 2019-01-14 NOTE — ED Provider Notes (Signed)
[facility] Emergency Department Provider Note   ____________________________________________   I have reviewed the triage vital signs and the nursing notes.   HISTORY  Chief Complaint Right hip pain  History limited by: Poor historian   HPI Joshua Lynn is a 78 y.o. male who presents to the emergency department today with primary concern for right hip pain.  Patient has a history of chronic alcohol use and unfortunately cannot give a good history as to what occurred.  He states he did have a fall but states it was not today.  Is unclear when he fell and hurt his right hip.  Is complaining of pain in his right hip.  He denies pain anywhere else.  Patient denies any recent illness.   Records reviewed. Per medical record review patient has a history of HTN  Past Medical History:  Diagnosis Date  . Hypertension     There are no active problems to display for this patient.   History reviewed. No pertinent surgical history.  Prior to Admission medications   Medication Sig Start Date End Date Taking? Authorizing Provider  predniSONE (DELTASONE) 20 MG tablet Take 2 tablets (40 mg total) by mouth daily. 06/18/18   Harvest Dark, MD    Allergies Patient has no known allergies.  History reviewed. No pertinent family history.  Social History Social History   Tobacco Use  . Smoking status: Never Smoker  . Smokeless tobacco: Never Used  Substance Use Topics  . Alcohol use: Yes  . Drug use: Never    Review of Systems Constitutional: No fever/chills Eyes: No visual changes. ENT: No sore throat. Cardiovascular: Denies chest pain. Respiratory: Denies shortness of breath. Gastrointestinal: No abdominal pain.  No nausea, no vomiting.  No diarrhea.   Genitourinary: Negative for dysuria. Musculoskeletal: Positive for right hip pain Skin: Negative for rash. Neurological: Negative for headaches, focal weakness or  numbness.  ____________________________________________   PHYSICAL EXAM:  VITAL SIGNS: ED Triage Vitals  Enc Vitals Group     BP 01/14/19 1754 (!) 161/104     Pulse Rate 01/14/19 1754 88     Resp 01/14/19 1754 16     Temp 01/14/19 1754 98.2 F (36.8 C)     Temp src --      SpO2 01/14/19 1754 100 %     Weight 01/14/19 1755 140 lb (63.5 kg)     Height 01/14/19 1755 5\' 2"  (1.575 m)     Head Circumference --      Peak Flow --      Pain Score 01/14/19 1754 9   Constitutional: Alert and oriented.  Eyes: Conjunctivae are normal.  ENT      Head: Normocephalic and atraumatic.      Nose: No congestion/rhinnorhea.      Mouth/Throat: Mucous membranes are moist.      Neck: No stridor. Hematological/Lymphatic/Immunilogical: No cervical lymphadenopathy. Cardiovascular: Normal rate, regular rhythm.  No murmurs, rubs, or gallops. Respiratory: Normal respiratory effort without tachypnea nor retractions. Breath sounds are clear and equal bilaterally. No wheezes/rales/rhonchi. Gastrointestinal: Soft and non tender. No rebound. No guarding.  Genitourinary: Deferred Musculoskeletal: Right hip tender to palpation and manipulation. DP 2+ bilaterally.  Neurologic:  Normal speech and language. No gross focal neurologic deficits are appreciated.  Skin:  Skin is warm, dry and intact. No rash noted. Psychiatric: Mood and affect are normal. Speech and behavior are normal. Patient exhibits appropriate insight and judgment.  ____________________________________________    LABS (pertinent positives/negatives)  BMP  na 130, k 4.0, glu 132, cr 0.89 CBC wbc 10.7, hgb 12.5, plt 266  ____________________________________________   EKG  I, Nance Pear, attending physician, personally viewed and interpreted this EKG  EKG Time: 2014 Rate: 82 Rhythm: sinus rhythm Axis: normal Intervals: qtc 473 QRS: narrow ST changes: no st elevation Impression: normal  ekg   ____________________________________________    RADIOLOGY  Right hip Transverse fracture of right hip  CXR No acute abnormality   ____________________________________________   PROCEDURES  Procedures  ____________________________________________   INITIAL IMPRESSION / ASSESSMENT AND PLAN / ED COURSE  Pertinent labs & imaging results that were available during my care of the patient were reviewed by me and considered in my medical decision making (see chart for details).   Patient presented to the emergency department today because of concerns for right hip pain.  Patient is a poor historian and cannot tell when the fall occurred.  X-ray however is concerning for a fracture through the femoral neck.  I discussed this with the patient.  Did discuss with patient need for admission.  Discussed with Dr. Roland Rack with orthopedic surgery.  ____________________________________________   FINAL CLINICAL IMPRESSION(S) / ED DIAGNOSES  Final diagnoses:  Closed fracture of right hip, initial encounter [facility])     Note: This dictation was prepared with Dragon dictation. Any transcriptional errors that result from this process are unintentional      Nance Pear, MD 01/14/19 2123

## 2019-01-14 NOTE — H&P (Signed)
[place] [facility] [place] NAME: Joshua Lynn    MR#:  [id number]  DATE OF BIRTH:  [birth year]  DATE OF ADMISSION:  01/14/2019  PRIMARY CARE PHYSICIAN: [facility]   REQUESTING/REFERRING PHYSICIAN: Archie Balboa, MD  CHIEF COMPLAINT:  Fall  HISTORY OF PRESENT ILLNESS:  Joshua Lynn  is a 78 y.o. male who presents with chief complaint as above.  Does not contribute much information to HPI today.  He came to the ED for complaint of right hip pain.  He is unable to state very clearly what happened, other than that he fell.  He states that he did not fall today.  Beyond that he does not give any further details.  On evaluation in the ED he is found to have a right hip fracture.  Patient also has a significant alcohol use history.  It is unclear what his baseline mental status is, or how far off from baseline he has at this time.  Hospitalist were called for admission  PAST MEDICAL HISTORY:   Past Medical History:  Diagnosis Date  . Alcohol use   . Anxiety   . GERD (gastroesophageal reflux disease)   . Hypertension      PAST SURGICAL HISTORY:   Past Surgical History:  Procedure Laterality Date  . left eye extraction       SOCIAL HISTORY:   Social History   Tobacco Use  . Smoking status: Never Smoker  . Smokeless tobacco: Never Used  Substance Use Topics  . Alcohol use: Yes     FAMILY HISTORY:   Family History  Problem Relation Age of Onset  . Alzheimer's disease Mother   . Suicidality Father   . Suicidality Brother      DRUG ALLERGIES:  No Known Allergies  MEDICATIONS AT HOME:   Prior to Admission medications   Medication Sig Start Date End Date Taking? Authorizing Provider  predniSONE (DELTASONE) 20 MG tablet Take 2 tablets (40 mg total) by mouth daily. 06/18/18   Harvest Dark, MD    REVIEW OF SYSTEMS:  Review of Systems  Unable to perform ROS: Acuity of condition     VITAL SIGNS:   Vitals:    01/14/19 2037 01/14/19 2045 01/14/19 2100 01/14/19 2115  BP: (!) 168/95     Pulse:    89  Resp:   20   Temp:      SpO2:  99%  100%  Weight:      Height:       Wt Readings from Last 3 Encounters:  01/14/19 63.5 kg  01/07/19 79.1 kg  10/04/18 56 kg    PHYSICAL EXAMINATION:  Physical Exam  Vitals reviewed. Constitutional: He appears well-developed and well-nourished. No distress.  HENT:  Head: Normocephalic and atraumatic.  Mouth/Throat: Oropharynx is clear and moist.  Eyes: Pupils are equal, round, and reactive to light. Conjunctivae and EOM are normal. No scleral icterus.  Neck: Normal range of motion. Neck supple. No JVD present. No thyromegaly present.  Cardiovascular: Normal rate, regular rhythm and intact distal pulses. Exam reveals no gallop and no friction rub.  No murmur heard. Respiratory: Effort normal and breath sounds normal. No respiratory distress. He has no wheezes. He has no rales.  GI: Soft. Bowel sounds are normal. He exhibits no distension. There is no abdominal tenderness.  Musculoskeletal:        General: Tenderness (Right hip) present. No edema.     Comments: No arthritis, no gout  Lymphadenopathy:  He has no cervical adenopathy.  Neurological:  Patient does not converse very much during exam, unable to fully assess his neurologic status  Skin: Skin is warm and dry. No rash noted. No erythema.  Psychiatric:  Unable to fully assess due to patient condition    LABORATORY PANEL:   CBC Recent Labs  Lab 01/14/19 2021  WBC 10.7*  HGB 12.5*  HCT 36.1*  PLT 266   ------------------------------------------------------------------------------------------------------------------  Chemistries  Recent Labs  Lab 01/14/19 2021  NA 130*  K 4.0  CL 96*  CO2 20*  GLUCOSE 132*  BUN 15  CREATININE 0.89  CALCIUM 9.3   ------------------------------------------------------------------------------------------------------------------  Cardiac  Enzymes No results for input(s): TROPONINI in the last 168 hours. ------------------------------------------------------------------------------------------------------------------  RADIOLOGY:  Dg Chest 1 View  Result Date: 01/14/2019 CLINICAL DATA:  Fall EXAM: CHEST  1 VIEW COMPARISON:  10/04/2018 FINDINGS: Heart and mediastinal contours are within normal limits. No focal opacities or effusions. No acute bony abnormality. IMPRESSION: No active disease. Electronically Signed   By: Rolm Baptise M.D.   On: 01/14/2019 19:46   Dg Hip Unilat W Or Wo Pelvis 2-3 Views Right  Result Date: 01/14/2019 CLINICAL DATA:  Fall EXAM: DG HIP (WITH OR WITHOUT PELVIS) 2-3V RIGHT COMPARISON:  None. FINDINGS: There is a transverse fracture of the right femoral neck with moderate foreshortening. Femoral head remains approximated with the acetabulum. No pelvic fracture. IMPRESSION: Transverse fracture of the right femoral neck. Electronically Signed   By: Ulyses Jarred M.D.   On: 01/14/2019 19:46    EKG:   Orders placed or performed during the hospital encounter of 01/14/19  . ED EKG  . ED EKG    IMPRESSION AND PLAN:  Principal Problem:   Closed right hip fracture [facility]) -orthopedic surgery consult for likely operative repair.  Will defer to their recommendations for further treatment of this problem.  PRN analgesia Active Problems:   Alcohol abuse -unclear if the patient's current status is due to his hip fracture, some other underlying cause, or withdrawal.  We will have him on CIWA protocol   HTN (hypertension) -patient does not have any listed antihypertensives on his home meds.  We will treat him with PRN antihypertensives with blood pressure goal less than 160/100   Anxiety -patient will be receiving Ativan per CIWA protocol as above  Chart review performed and case discussed with ED provider. Labs, imaging and/or ECG reviewed by provider and discussed with patient/family. Management plans discussed  with the patient and/or family.  COVID-19 status: Tested negative     DVT PROPHYLAXIS: Mechanical only  GI PROPHYLAXIS:  None  ADMISSION STATUS: Inpatient     CODE STATUS: Full Code Status History    Date Active Date Inactive Code Status Order ID Comments User Context   01/07/2019 1517 01/07/2019 2318 Full Code [id number]  Merlyn Lot, MD ED   Advance Care Planning Activity      TOTAL TIME TAKING CARE OF THIS PATIENT: 45 minutes.   This patient was evaluated in the context of the global COVID-19 pandemic, which necessitated consideration that the patient might be at risk for infection with the SARS-CoV-2 virus that causes COVID-19. Institutional protocols and algorithms that pertain to the evaluation of patients at risk for COVID-19 are in a state of rapid change based on information released by regulatory bodies including the CDC and federal and state organizations. These policies and algorithms were followed to the best of this provider's knowledge to date during the patient's care at  this facility.  Ethlyn Daniels 01/14/2019, 9:34 PM  [facility]  Office  [phone]  CC: Primary care physician; [facility], [facility]  Note:  This document was prepared using Systems analyst and may include unintentional dictation errors.

## 2019-01-14 NOTE — ED Notes (Signed)
Review of pt's previous charts reveal hx of chronic ETOH abuse and hx of frequent falls for which he has been seen before.  Pt voices no new concerns or symptoms other that hip pain from a recent fall.  Pt voicing that he is unable to sit in the wheelchair for long. Pt informed that there is currently a wait and that we will room him as soon as something becomes available.

## 2019-01-14 NOTE — ED Notes (Signed)
EDP Joshua Lynn and this RN able to feel dorsalis pedis pulse further up pt's foot. 2+ bilaterally. Pt given urinal as he stated he needs to urinate.

## 2019-01-14 NOTE — ED Notes (Addendum)
Pt frustrated with cardiac monitor; wants to stand up; wants to put his own clothes on. Educated. Can't get accurate BP bc pt keeps moving/banding arm.

## 2019-01-14 NOTE — ED Notes (Signed)
Pt keeps moving; best EKG able to collect prined.

## 2019-01-14 NOTE — ED Notes (Addendum)
Pt states he needs to use urinal; has on boxers; pt refuses to have this RN help him remove boxers. Pt given warm blankets.

## 2019-01-14 NOTE — ED Triage Notes (Signed)
Pt to ER states he has fallen several times over the last several days.  States he is having back pain and right hip pain.

## 2019-01-14 NOTE — ED Notes (Signed)
Pt pulled off cardiac leads. Pt will not allow this RN to replace them.

## 2019-01-14 NOTE — ED Notes (Signed)
Pt to room from imaging now.

## 2019-01-14 NOTE — ED Notes (Signed)
Pt urinated all in bed. Allowed this RN and Casey Burkitt to provide peri care and change bed linens. Pt irritated; scowling at staff.

## 2019-01-14 NOTE — ED Notes (Addendum)
Unable to palpate pulse at R dorsalis pedis or ankle; unable to hear on doppler of dorsalis pedis; unable to attempt doppler at ankle d/t pt refusing and unable to get probe between ankles as pt has them too close together. Cap refill <3; R foot a little more red/purple than L foot; pt c/o severe pain in R hip/leg; pt's L foot cool but pt's R foot cold. Will notify EDP Archie Balboa.

## 2019-01-15 ENCOUNTER — Encounter: Payer: Self-pay | Admitting: Anesthesiology

## 2019-01-15 LAB — BASIC METABOLIC PANEL
Anion gap: 13 (ref 5–15)
BUN: 12 mg/dL (ref 8–23)
CO2: 19 mmol/L — ABNORMAL LOW (ref 22–32)
Calcium: 8.9 mg/dL (ref 8.9–10.3)
Chloride: 97 mmol/L — ABNORMAL LOW (ref 98–111)
Creatinine, Ser: 0.93 mg/dL (ref 0.61–1.24)
GFR calc Af Amer: >60 mL/min (ref >60)
GFR calc non Af Amer: >60 mL/min (ref >60)
Glucose, Bld: 112 mg/dL — ABNORMAL HIGH (ref 70–99)
Potassium: 3.7 mmol/L (ref 3.5–5.1)
Sodium: 129 mmol/L — ABNORMAL LOW (ref 135–145)

## 2019-01-15 LAB — CBC
HCT: 35.2 % — ABNORMAL LOW (ref 39.0–52.0)
Hemoglobin: 12.1 g/dL — ABNORMAL LOW (ref 13.0–17.0)
MCH: 30.9 pg (ref 26.0–34.0)
MCHC: 34.4 g/dL (ref 30.0–36.0)
MCV: 90 fL (ref 80.0–100.0)
Platelets: 244 10*3/uL (ref 150–400)
RBC: 3.91 MIL/uL — ABNORMAL LOW (ref 4.22–5.81)
RDW: 13.1 % (ref 11.5–15.5)
WBC: 10.5 10*3/uL (ref 4.0–10.5)
nRBC: 0 % (ref 0.0–0.2)

## 2019-01-15 LAB — MRSA PCR SCREENING: MRSA by PCR: NEGATIVE

## 2019-01-15 MED ORDER — SODIUM CHLORIDE 0.9 % IV SOLN
INTRAVENOUS | Status: AC
  Administered 2019-01-15 – 2019-01-16 (×2): via INTRAVENOUS

## 2019-01-15 NOTE — Progress Notes (Signed)
[facility] [place] NAME: Senay Sistrunk    MR#:  [id number]  DATE OF BIRTH:  [birth year]  SUBJECTIVE:  CHIEF COMPLAINT:  No chief complaint on file.  -Patient came in after a fall and right femoral neck fracture.  Complains of significant right leg pain. -On CIWA protocol for use of alcohol as outpatient  REVIEW OF SYSTEMS:  Review of Systems  Constitutional: Positive for malaise/fatigue. Negative for chills and fever.  HENT: Negative for congestion, ear discharge, hearing loss and nosebleeds.   Eyes: Positive for blurred vision.  Respiratory: Negative for cough, shortness of breath and wheezing.   Cardiovascular: Negative for chest pain and palpitations.  Gastrointestinal: Negative for abdominal pain, constipation, diarrhea, nausea and vomiting.  Genitourinary: Negative for dysuria.  Musculoskeletal: Positive for joint pain and myalgias.  Neurological: Negative for dizziness, seizures and headaches.  Psychiatric/Behavioral: Negative for depression.    DRUG ALLERGIES:  No Known Allergies  VITALS:  Blood pressure (!) 142/87, pulse 79, temperature 98.1 F (36.7 C), resp. rate 18, height 5\' 5"  (1.651 m), weight 67.1 kg, SpO2 97 %.  PHYSICAL EXAMINATION:  Physical Exam   GENERAL:  78 y.o.-year-old disheveled appearing patient lying in the bed with no acute distress.  EYES: Right eye is enucleated, left pupil equal, round, reactive to light and accommodation. No scleral icterus. Extraocular muscles intact.  HEENT: Head atraumatic, normocephalic. Oropharynx and nasopharynx clear.  NECK:  Supple, no jugular venous distention. No thyroid enlargement, no tenderness.  LUNGS: Normal breath sounds bilaterally, no wheezing, rales,rhonchi or crepitation. No use of accessory muscles of respiration.  Decreased bibasilar breath sounds CARDIOVASCULAR: S1, S2 normal. No murmurs, rubs, or gallops.  ABDOMEN: Soft, nontender, nondistended. Bowel sounds  present. No organomegaly or mass.  EXTREMITIES: No pedal edema, cyanosis, or clubbing.  No hand tremors today NEUROLOGIC: Cranial nerves II through XII are intact. Muscle strength 5/5 in all extremities except right lower extremity, limited motion due to pain. Sensation intact. Gait not checked.  PSYCHIATRIC: The patient is alert and oriented x 3.  SKIN: No obvious rash, lesion, or ulcer.    LABORATORY PANEL:   CBC Recent Labs  Lab 01/15/19 0647  WBC 10.5  HGB 12.1*  HCT 35.2*  PLT 244   ------------------------------------------------------------------------------------------------------------------  Chemistries  Recent Labs  Lab 01/15/19 0647  NA 129*  K 3.7  CL 97*  CO2 19*  GLUCOSE 112*  BUN 12  CREATININE 0.93  CALCIUM 8.9   ------------------------------------------------------------------------------------------------------------------  Cardiac Enzymes No results for input(s): TROPONINI in the last 168 hours. ------------------------------------------------------------------------------------------------------------------  RADIOLOGY:  Dg Chest 1 View  Result Date: 01/14/2019 CLINICAL DATA:  Fall EXAM: CHEST  1 VIEW COMPARISON:  10/04/2018 FINDINGS: Heart and mediastinal contours are within normal limits. No focal opacities or effusions. No acute bony abnormality. IMPRESSION: No active disease. Electronically Signed   By: Rolm Baptise M.D.   On: 01/14/2019 19:46   Dg Hip Unilat W Or Wo Pelvis 2-3 Views Right  Result Date: 01/14/2019 CLINICAL DATA:  Fall EXAM: DG HIP (WITH OR WITHOUT PELVIS) 2-3V RIGHT COMPARISON:  None. FINDINGS: There is a transverse fracture of the right femoral neck with moderate foreshortening. Femoral head remains approximated with the acetabulum. No pelvic fracture. IMPRESSION: Transverse fracture of the right femoral neck. Electronically Signed   By: Ulyses Jarred M.D.   On: 01/14/2019 19:46    EKG:   Orders placed or performed during  the hospital encounter of 01/14/19  .  ED EKG  . ED EKG    ASSESSMENT AND PLAN:   78 year old male with past medical history significant for hypertension, GERD, anxiety, alcohol abuse presents to hospital secondary to a fall that he does not remember and noted to have right femoral neck fracture.  1.  Right femoral neck fracture-admitted, orthopedics consulted. -Patient for surgery today -Continue pain control.  Will need physical therapy and DVT prophylaxis after surgery  2.  Hyponatremia-continue IV fluids  3..  Alcohol abuse-monitor closely on CIWA protocol.  4.  DVT prophylaxis-will be started after surgery   All the records are reviewed and case discussed with Care Management/Social Workerr. Management plans discussed with the patient, family and they are in agreement.  CODE STATUS: Full code  TOTAL TIME TAKING CARE OF THIS PATIENT: 39 minutes.   POSSIBLE D/C IN 2-3 DAYS, DEPENDING ON CLINICAL CONDITION.   Gladstone Lighter M.D on 01/15/2019 at 1:29 PM  Between 7am to 6pm - Pager - [phone]  After 6pm go to [email or web address] - password [email or web address] [facility]  Office  [phone]  CC: Primary care physician; Administration, [employer]

## 2019-01-15 NOTE — Consult Note (Signed)
ORTHOPAEDIC CONSULTATION  REQUESTING PHYSICIAN: Gladstone Lighter, MD  Chief Complaint:   Right hip pain.  History of Present Illness: Joshua Lynn is a 78 y.o. male with a history of hypertension, gastroesophageal reflux disease, anxiety, and ethanol abuse who presented to the emergency room last evening with right hip pain.  The patient does not recall exactly when he fell but that it was not yesterday.  X-rays in the emergency room demonstrated a displaced right femoral neck fracture, so the patient has been admitted to the hospitalist for definitive management of his injury.  The patient cannot give a good history as to the details of the fall, whether he sustained any additional injuries, or whether he had any symptoms that predisposed him to the fall.  Past Medical History:  Diagnosis Date  . Alcohol use   . Anxiety   . GERD (gastroesophageal reflux disease)   . Hypertension    Past Surgical History:  Procedure Laterality Date  . left eye extraction     Social History   Socioeconomic History  . Marital status: Widowed    Spouse name: Not on file  . Number of children: Not on file  . Years of education: Not on file  . Highest education level: Not on file  Occupational History  . Not on file  Social Needs  . Financial resource strain: Not on file  . Food insecurity    Worry: Not on file    Inability: Not on file  . Transportation needs    Medical: Not on file    Non-medical: Not on file  Tobacco Use  . Smoking status: Never Smoker  . Smokeless tobacco: Never Used  Substance and Sexual Activity  . Alcohol use: Yes  . Drug use: Never  . Sexual activity: Not Currently  Lifestyle  . Physical activity    Days per week: Not on file    Minutes per session: Not on file  . Stress: Not on file  Relationships  . Social Herbalist on phone: Not on file    Gets together: Not on file    Attends  religious service: Not on file    Active member of club or organization: Not on file    Attends meetings of clubs or organizations: Not on file    Relationship status: Not on file  Other Topics Concern  . Not on file  Social History Narrative  . Not on file   Family History  Problem Relation Age of Onset  . Alzheimer's disease Mother   . Suicidality Father   . Suicidality Brother    No Known Allergies Prior to Admission medications   Medication Sig Start Date End Date Taking? Authorizing Provider  predniSONE (DELTASONE) 20 MG tablet Take 2 tablets (40 mg total) by mouth daily. 06/18/18   Harvest Dark, MD   Dg Chest 1 View  Result Date: 01/14/2019 CLINICAL DATA:  Fall EXAM: CHEST  1 VIEW COMPARISON:  10/04/2018 FINDINGS: Heart and mediastinal contours are within normal limits. No focal opacities or effusions. No acute bony abnormality. IMPRESSION: No active disease. Electronically Signed   By: Rolm Baptise M.D.   On: 01/14/2019 19:46   Dg Hip Unilat W Or Wo Pelvis 2-3 Views Right  Result Date: 01/14/2019 CLINICAL DATA:  Fall EXAM: DG HIP (WITH OR WITHOUT PELVIS) 2-3V RIGHT COMPARISON:  None. FINDINGS: There is a transverse fracture of the right femoral neck with moderate foreshortening. Femoral head remains approximated with the acetabulum.  No pelvic fracture. IMPRESSION: Transverse fracture of the right femoral neck. Electronically Signed   By: Ulyses Jarred M.D.   On: 01/14/2019 19:46    Positive ROS: All other systems have been reviewed and were otherwise negative with the exception of those mentioned in the HPI and as above.  Physical Exam: General:  Alert, no acute distress Psychiatric:  Patient appears to be competent for consent with normal mood and affect   Cardiovascular:  No pedal edema Respiratory:  No wheezing, non-labored breathing GI:  Abdomen is soft and non-tender Skin:  No lesions in the area of chief complaint Neurologic:  Sensation intact  distally Lymphatic:  No axillary or cervical lymphadenopathy  Orthopedic Exam:  Orthopedic examination is limited to the right hip and lower extremity.  The right lower extremity somewhat shortened and externally rotated as compared to the left lower extremity.  Skin inspection around the right hip is unremarkable.  No swelling, erythema, ecchymosis, abrasions, or other skin abnormalities identified.  He has moderate tenderness to palpation over the lateral aspect of the right hip.  He has more severe pain with any attempted active or passive motion of the right hip or lower extremity.  He is able dorsiflex and plantarflex his toes and ankle.  Sensation is intact light touch to all distributions.  He has good capillary refill to his right foot.  X-rays:  X-rays of the pelvis and right hip are available for review and have been reviewed by myself.  These films demonstrate a displaced right femoral neck fracture.  No significant degenerative changes are identified.  No lytic lesions or other acute bony abnormalities are noted.  Assessment: Displaced right femoral neck fracture.  Plan: The treatment options have been discussed with the patient, including both surgical and nonsurgical choices.  The patient would like to proceed with surgical intervention to include a right hip hemiarthroplasty.  This procedure has been discussed in detail, as have the potential risks (including bleeding, infection, nerve and/or blood vessel injury, persistent recurrent pain, stiffness, loosening of and/or failure of the components, dislocation, leg length inequality, need for further surgery, blood clots, strokes, heart attacks and/or arrhythmias, etc.) and benefits.  The patient states his understanding wishes to proceed.  A formal written consent will be obtained by the nursing staff.  Thank you for asking me to participate in the care of this unfortunate man.  I will be happy to follow him with you.   Pascal Lux, MD  Beeper #:  [phone]  01/15/2019 7:45 AM

## 2019-01-15 NOTE — TOC Initial Note (Signed)
Transition of Care [facility]) - Initial/Assessment Note    Patient Details  Name: Joshua Lynn MRN: [id number] Date of Birth: 09/16/1940  Transition of Care [facility]) CM/SW Contact:    Deneise Getty, Lenice Llamas Phone Number: [phone]  01/15/2019, 5:01 PM  Clinical Narrative:  Patient has a hip fracture and surgery and PT are pending. CSW met with patient alone at bedside today prior to surgery to discuss D/C plan. Patient was alert and oriented X3 and reported that he lives alone in [facility]. CSW explained that after surgery PT will evaluate patient and make a recommendation of home health or SNF. Patient prefers to D/C home but it agreeable to SNF if needed. FL2 complete and faxed out. CSW will continue to follow and assist as needed.             Expected Discharge Plan: Skilled Nursing Facility Barriers to Discharge: Continued Medical Work up   Patient Goals and CMS Choice        Expected Discharge Plan and Services Expected Discharge Plan: [place] In-house Referral: Clinical Social Work Discharge Planning Services: CM Consult   Living arrangements for the past 2 months: [place]                                      Prior Living Arrangements/Services Living arrangements for the past 2 months: Single Family Home Lives with:: Self Patient language and need for interpreter reviewed:: No Do you feel safe going back to the place where you live?: Yes      Need for Family Participation in Patient Care: Yes (Comment) Care giver support system in place?: No (comment)   Criminal Activity/Legal Involvement Pertinent to Current Situation/Hospitalization: No - Comment as needed  Activities of Daily Living Home Assistive Devices/Equipment: Walker (specify type) ADL Screening (condition at time of admission) Patient's cognitive ability adequate to safely complete daily activities?: Yes Is the patient deaf or have difficulty hearing?: No Does the  patient have difficulty seeing, even when wearing glasses/contacts?: Yes(does not see onthe right eye) Does the patient have difficulty concentrating, remembering, or making decisions?: Yes Patient able to express need for assistance with ADLs?: Yes Does the patient have difficulty dressing or bathing?: Yes Independently performs ADLs?: No Does the patient have difficulty walking or climbing stairs?: Yes Weakness of Legs: Both Weakness of Arms/Hands: None  Permission Sought/Granted Permission sought to share information with : Family Supports Permission granted to share information with : Yes, Verbal Permission Granted              Emotional Assessment Appearance:: Appears stated age   Affect (typically observed): Pleasant, Calm Orientation: : Oriented to Self, Oriented to Place, Fluctuating Orientation (Suspected and/or reported Sundowners), Oriented to  Time, Oriented to Situation Alcohol / Substance Use: Not Applicable Psych Involvement: No (comment)  Admission diagnosis:  Fall [W19.XXXA] Closed fracture of right hip, initial encounter [facility]) [S72.001A] Patient Active Problem List   Diagnosis Date Noted  . Closed right hip fracture ([place]) 01/14/2019  . Alcohol abuse 01/14/2019  . HTN (hypertension) 01/14/2019  . Anxiety 01/14/2019  . GERD (gastroesophageal reflux disease) 01/14/2019   PCP:  Administration, [facility]   [id number] [place] [id number]: [phone]: [phone]    Social Determinants of Health (SDOH) Interventions  Readmission Risk Interventions No flowsheet data found.

## 2019-01-15 NOTE — Progress Notes (Signed)
Because of OR emergencies surgery could not be done today.  I will be able do that tomorrow for Dr. Poggi.  Orders entered for diet tonight and n.p.o. after midnight as well as consent. 

## 2019-01-15 NOTE — NC FL2 (Signed)
[place] LEVEL OF CARE SCREENING TOOL     IDENTIFICATION  Patient Name: Joshua Lynn Birthdate: 1941/01/20 Sex: male Admission Date (Current Location): 01/14/2019  [facility] and [place] Number:  Engineering geologist and Address:         Provider Number: [id number]  Attending Physician Name and Address:  Gladstone Lighter, MD  Relative Name and Phone Number:       Current Level of Care: Hospital Recommended Level of Care: [place] Prior Approval Number:    Date Approved/Denied:   PASRR Number:    Discharge Plan: SNF    Current Diagnoses: Patient Active Problem List   Diagnosis Date Noted  . Closed right hip fracture ([place]) 01/14/2019  . Alcohol abuse 01/14/2019  . HTN (hypertension) 01/14/2019  . Anxiety 01/14/2019  . GERD (gastroesophageal reflux disease) 01/14/2019    Orientation RESPIRATION BLADDER Height & Weight     Self, Situation, Place  Normal Continent Weight: 147 lb 14.9 oz (67.1 kg) Height:  5\' 5"  (165.1 cm)  BEHAVIORAL SYMPTOMS/MOOD NEUROLOGICAL BOWEL NUTRITION STATUS      Continent Diet(Diet: Regular)  AMBULATORY STATUS COMMUNICATION OF NEEDS Skin   Extensive Assist Verbally Surgical wounds                       Personal Care Assistance Level of Assistance  Bathing, Feeding, Dressing Bathing Assistance: Limited assistance Feeding assistance: Independent Dressing Assistance: Limited assistance     Functional Limitations Info  Sight, Hearing, Speech Sight Info: Impaired Hearing Info: Impaired Speech Info: Adequate    SPECIAL CARE FACTORS FREQUENCY  PT (By licensed PT), OT (By licensed OT)     PT Frequency: 5 OT Frequency: 5            Contractures      Additional Factors Info  Code Status, Allergies Code Status Info: Full Code. Allergies Info: No Known Allergies.           Current Medications (01/15/2019):  This is the current hospital active medication list Current  Facility-Administered Medications  Medication Dose Route Frequency Provider Last Rate Last Dose  . 0.9 %  sodium chloride infusion   Intravenous Continuous Gladstone Lighter, MD 75 mL/hr at 01/15/19 1344    . acetaminophen (TYLENOL) tablet 650 mg  650 mg Oral Q6H PRN Lance Coon, MD       Or  . acetaminophen (TYLENOL) suppository 650 mg  650 mg Rectal Q6H PRN Lance Coon, MD      . ceFAZolin (ANCEF) IVPB 2g/100 mL premix  2 g Intravenous Once Poggi, Marshall Cork, MD   Stopped at 01/15/19 1549  . folic acid (FOLVITE) tablet 1 mg  1 mg Oral Daily Lance Coon, MD      . labetalol (NORMODYNE) injection 10 mg  10 mg Intravenous Q2H PRN Lance Coon, MD      . LORazepam (ATIVAN) injection 0-4 mg  0-4 mg Intravenous Q6H Lance Coon, MD       Followed by  . [START ON 01/16/2019] LORazepam (ATIVAN) injection 0-4 mg  0-4 mg Intravenous Q12H Lance Coon, MD      . LORazepam (ATIVAN) tablet 1 mg  1 mg Oral Q6H PRN Lance Coon, MD       Or  . LORazepam (ATIVAN) injection 1 mg  1 mg Intravenous Q6H PRN Lance Coon, MD      . morphine 2 MG/ML injection 2 mg  2 mg Intravenous Q4H PRN Lance Coon, MD  2 mg at 01/15/19 1604  . multivitamin with minerals tablet 1 tablet  1 tablet Oral Daily Lance Coon, MD      . ondansetron [facility]) tablet 4 mg  4 mg Oral Q6H PRN Lance Coon, MD       Or  . ondansetron [facility]) injection 4 mg  4 mg Intravenous Q6H PRN Lance Coon, MD      . oxyCODONE (Oxy IR/ROXICODONE) immediate release tablet 5 mg  5 mg Oral Q4H PRN Lance Coon, MD   5 mg at 01/14/19 2319  . thiamine (VITAMIN B-1) tablet 100 mg  100 mg Oral Daily Lance Coon, MD       Or  . thiamine (B-1) injection 100 mg  100 mg Intravenous Daily Lance Coon, MD         Discharge Medications: Please see discharge summary for a list of discharge medications.  Relevant Imaging Results:  Relevant Lab Results:   Additional Information SSN: [id number]  Moraima Burd, Veronia Beets, LCSW

## 2019-01-15 NOTE — Plan of Care (Signed)

## 2019-01-16 ENCOUNTER — Inpatient Hospital Stay: Payer: Medicare Other | Admitting: Certified Registered"

## 2019-01-16 ENCOUNTER — Inpatient Hospital Stay: Payer: Medicare Other

## 2019-01-16 ENCOUNTER — Encounter: Payer: Self-pay | Admitting: *Deleted

## 2019-01-16 ENCOUNTER — Encounter
Admission: EM | Disposition: A | Discharge status: Other Acute Inpatient Hospital | Payer: Self-pay | Source: Home / Self Care | Attending: Internal Medicine

## 2019-01-16 HISTORY — PX: ANTERIOR APPROACH HEMI HIP ARTHROPLASTY: SHX6690

## 2019-01-16 LAB — CBC
HCT: 33.2 % — ABNORMAL LOW (ref 39.0–52.0)
Hemoglobin: 11.4 g/dL — ABNORMAL LOW (ref 13.0–17.0)
MCH: 31.8 pg (ref 26.0–34.0)
MCHC: 34.3 g/dL (ref 30.0–36.0)
MCV: 92.7 fL (ref 80.0–100.0)
Platelets: 230 10*3/uL (ref 150–400)
RBC: 3.58 MIL/uL — ABNORMAL LOW (ref 4.22–5.81)
RDW: 13.3 % (ref 11.5–15.5)
WBC: 16.9 10*3/uL — ABNORMAL HIGH (ref 4.0–10.5)
nRBC: 0 % (ref 0.0–0.2)

## 2019-01-16 LAB — CREATININE, SERUM
Creatinine, Ser: 1 mg/dL (ref 0.61–1.24)
GFR calc Af Amer: >60 mL/min (ref >60)
GFR calc non Af Amer: >60 mL/min (ref >60)

## 2019-01-16 SURGERY — HEMIARTHROPLASTY, HIP, DIRECT ANTERIOR APPROACH, FOR FRACTURE
Anesthesia: Choice | Laterality: Right

## 2019-01-16 SURGERY — HEMIARTHROPLASTY, HIP, DIRECT ANTERIOR APPROACH, FOR FRACTURE
Anesthesia: Spinal | Site: Hip | Laterality: Right

## 2019-01-16 MED ORDER — DOCUSATE SODIUM 100 MG PO CAPS
100.0000 mg | ORAL_CAPSULE | Freq: Two times a day (BID) | ORAL | Status: DC
  Administered 2019-01-16 – 2019-01-19 (×5): 100 mg via ORAL
  Filled 2019-01-16 (×6): qty 1

## 2019-01-16 MED ORDER — SODIUM CHLORIDE FLUSH 0.9 % IV SOLN
INTRAVENOUS | Status: AC
  Filled 2019-01-16: qty 40

## 2019-01-16 MED ORDER — ALUM & MAG HYDROXIDE-SIMETH 200-200-20 MG/5ML PO SUSP: 30.0000 mL | ORAL | Status: DC | PRN

## 2019-01-16 MED ORDER — MORPHINE SULFATE (PF) 2 MG/ML IV SOLN: 0.5000 mg | INTRAVENOUS | Status: DC | PRN

## 2019-01-16 MED ORDER — BUPIVACAINE-EPINEPHRINE (PF) 0.25% -1:200000 IJ SOLN
INTRAMUSCULAR | Status: AC
  Filled 2019-01-16: qty 30

## 2019-01-16 MED ORDER — SODIUM CHLORIDE 0.9 % IV SOLN
1.0000 g | INTRAVENOUS | Status: AC
  Administered 2019-01-16 – 2019-01-18 (×3): 1 g via INTRAVENOUS
  Filled 2019-01-16 (×2): qty 10
  Filled 2019-01-16: qty 1

## 2019-01-16 MED ORDER — BUPIVACAINE HCL (PF) 0.5 % IJ SOLN
INTRAMUSCULAR | Status: DC | PRN
  Administered 2019-01-16: 3 mL

## 2019-01-16 MED ORDER — PROPOFOL 10 MG/ML IV BOLUS
INTRAVENOUS | Status: AC
  Filled 2019-01-16: qty 40

## 2019-01-16 MED ORDER — SODIUM CHLORIDE 0.9 % IV SOLN
INTRAVENOUS | Status: DC
  Administered 2019-01-16 – 2019-01-18 (×5): via INTRAVENOUS

## 2019-01-16 MED ORDER — DIPHENHYDRAMINE HCL 12.5 MG/5ML PO ELIX
12.5000 mg | ORAL_SOLUTION | ORAL | Status: DC | PRN
  Administered 2019-01-16: 25 mg via ORAL
  Filled 2019-01-16: qty 10

## 2019-01-16 MED ORDER — PROPOFOL 500 MG/50ML IV EMUL
INTRAVENOUS | Status: DC | PRN
  Administered 2019-01-16: 50 ug/kg/min via INTRAVENOUS

## 2019-01-16 MED ORDER — ZOLPIDEM TARTRATE 5 MG PO TABS
5.0000 mg | ORAL_TABLET | Freq: Every evening | ORAL | Status: DC | PRN
  Administered 2019-01-16: 22:00:00 5 mg via ORAL
  Filled 2019-01-16: qty 1

## 2019-01-16 MED ORDER — ACETAMINOPHEN 500 MG PO TABS
500.0000 mg | ORAL_TABLET | Freq: Four times a day (QID) | ORAL | Status: AC
  Administered 2019-01-17 (×3): 500 mg via ORAL
  Filled 2019-01-16 (×4): qty 1

## 2019-01-16 MED ORDER — ONDANSETRON HCL 4 MG/2ML IJ SOLN: 4.0000 mg | Freq: Once | INTRAMUSCULAR | Status: DC | PRN

## 2019-01-16 MED ORDER — FENTANYL CITRATE (PF) 100 MCG/2ML IJ SOLN: 25.0000 ug | INTRAMUSCULAR | Status: DC | PRN

## 2019-01-16 MED ORDER — CEFAZOLIN SODIUM-DEXTROSE 1-4 GM/50ML-% IV SOLN
1.0000 g | Freq: Four times a day (QID) | INTRAVENOUS | Status: AC
  Administered 2019-01-16 – 2019-01-17 (×2): 1 g via INTRAVENOUS
  Filled 2019-01-16 (×2): qty 50

## 2019-01-16 MED ORDER — MIDAZOLAM HCL 2 MG/2ML IJ SOLN
INTRAMUSCULAR | Status: AC
  Filled 2019-01-16: qty 2

## 2019-01-16 MED ORDER — HYDROCODONE-ACETAMINOPHEN 7.5-325 MG PO TABS
1.0000 | ORAL_TABLET | ORAL | Status: DC | PRN
  Administered 2019-01-16: 20:00:00 2 via ORAL
  Filled 2019-01-16: qty 2

## 2019-01-16 MED ORDER — FENTANYL CITRATE (PF) 100 MCG/2ML IJ SOLN
INTRAMUSCULAR | Status: AC
  Filled 2019-01-16: qty 2

## 2019-01-16 MED ORDER — NEOMYCIN-POLYMYXIN B GU 40-200000 IR SOLN
Status: AC
  Filled 2019-01-16: qty 4

## 2019-01-16 MED ORDER — METOCLOPRAMIDE HCL 5 MG/ML IJ SOLN: 5.0000 mg | Freq: Three times a day (TID) | INTRAMUSCULAR | Status: DC | PRN

## 2019-01-16 MED ORDER — MENTHOL 3 MG MT LOZG
1.0000 | LOZENGE | OROMUCOSAL | Status: DC | PRN
  Filled 2019-01-16: qty 9

## 2019-01-16 MED ORDER — BUPIVACAINE LIPOSOME 1.3 % IJ SUSP
INTRAMUSCULAR | Status: AC
  Filled 2019-01-16: qty 20

## 2019-01-16 MED ORDER — METOCLOPRAMIDE HCL 10 MG PO TABS: 5.0000 mg | ORAL_TABLET | Freq: Three times a day (TID) | ORAL | Status: DC | PRN

## 2019-01-16 MED ORDER — CEFAZOLIN SODIUM-DEXTROSE 2-4 GM/100ML-% IV SOLN
2.0000 g | INTRAVENOUS | Status: AC
  Administered 2019-01-16: 2 g via INTRAVENOUS
  Filled 2019-01-16: qty 100

## 2019-01-16 MED ORDER — TRAMADOL HCL 50 MG PO TABS
50.0000 mg | ORAL_TABLET | Freq: Four times a day (QID) | ORAL | Status: DC
  Administered 2019-01-16 – 2019-01-19 (×7): 50 mg via ORAL
  Filled 2019-01-16 (×9): qty 1

## 2019-01-16 MED ORDER — FENTANYL CITRATE (PF) 100 MCG/2ML IJ SOLN
INTRAMUSCULAR | Status: DC | PRN
  Administered 2019-01-16 (×2): 50 ug via INTRAVENOUS

## 2019-01-16 MED ORDER — METHOCARBAMOL 500 MG PO TABS
500.0000 mg | ORAL_TABLET | Freq: Four times a day (QID) | ORAL | Status: DC | PRN
  Administered 2019-01-16: 20:00:00 500 mg via ORAL
  Filled 2019-01-16: qty 1

## 2019-01-16 MED ORDER — LACTATED RINGERS IV SOLN
INTRAVENOUS | Status: DC | PRN
  Administered 2019-01-16 (×2): via INTRAVENOUS

## 2019-01-16 MED ORDER — BISACODYL 10 MG RE SUPP: 10.0000 mg | Freq: Every day | RECTAL | Status: DC | PRN

## 2019-01-16 MED ORDER — PHENOL 1.4 % MT LIQD
1.0000 | OROMUCOSAL | Status: DC | PRN
  Filled 2019-01-16: qty 177

## 2019-01-16 MED ORDER — MAGNESIUM HYDROXIDE 400 MG/5ML PO SUSP: 30.0000 mL | Freq: Every day | ORAL | Status: DC | PRN

## 2019-01-16 MED ORDER — ENOXAPARIN SODIUM 40 MG/0.4ML ~~LOC~~ SOLN
40.0000 mg | SUBCUTANEOUS | Status: DC
  Filled 2019-01-16: qty 0.4

## 2019-01-16 MED ORDER — METHOCARBAMOL 1000 MG/10ML IJ SOLN
500.0000 mg | Freq: Four times a day (QID) | INTRAVENOUS | Status: DC | PRN
  Filled 2019-01-16: qty 5

## 2019-01-16 MED ORDER — BUPIVACAINE-EPINEPHRINE 0.25% -1:200000 IJ SOLN
INTRAMUSCULAR | Status: DC | PRN
  Administered 2019-01-16: 30 mL

## 2019-01-16 MED ORDER — DOXAZOSIN MESYLATE 4 MG PO TABS
4.0000 mg | ORAL_TABLET | Freq: Every day | ORAL | Status: DC
  Administered 2019-01-16 – 2019-01-18 (×3): 4 mg via ORAL
  Filled 2019-01-16 (×4): qty 1

## 2019-01-16 MED ORDER — MIDAZOLAM HCL 5 MG/5ML IJ SOLN
INTRAMUSCULAR | Status: DC | PRN
  Administered 2019-01-16 (×2): 1 mg via INTRAVENOUS

## 2019-01-16 MED ORDER — PHENYLEPHRINE HCL (PRESSORS) 10 MG/ML IV SOLN
INTRAVENOUS | Status: DC | PRN
  Administered 2019-01-16: 100 ug via INTRAVENOUS

## 2019-01-16 MED ORDER — KETAMINE HCL 10 MG/ML IJ SOLN
INTRAMUSCULAR | Status: DC | PRN
  Administered 2019-01-16: 35 mg via INTRAVENOUS

## 2019-01-16 MED ORDER — MAGNESIUM CITRATE PO SOLN
1.0000 | Freq: Once | ORAL | Status: DC | PRN
  Filled 2019-01-16: qty 296

## 2019-01-16 MED ORDER — KETAMINE HCL 50 MG/ML IJ SOLN
INTRAMUSCULAR | Status: AC
  Filled 2019-01-16: qty 10

## 2019-01-16 MED ORDER — SODIUM CHLORIDE 0.9 % IV SOLN
INTRAVENOUS | Status: DC | PRN
  Administered 2019-01-16: 15:00:00 60 mL

## 2019-01-16 MED ORDER — NEOMYCIN-POLYMYXIN B GU 40-200000 IR SOLN
Status: DC | PRN
  Administered 2019-01-16: 4 mL

## 2019-01-16 MED ORDER — HYDROCODONE-ACETAMINOPHEN 5-325 MG PO TABS
1.0000 | ORAL_TABLET | ORAL | Status: DC | PRN
  Administered 2019-01-18 – 2019-01-19 (×2): 1 via ORAL
  Filled 2019-01-16 (×2): qty 1

## 2019-01-16 SURGICAL SUPPLY — 58 items
BAG DECANTER FOR FLEXI CONT (MISCELLANEOUS) IMPLANT
BLADE SAGITTAL WIDE XTHICK NO (BLADE) ×3 IMPLANT
BLADE SURG SZ20 CARB STEEL (BLADE) ×3 IMPLANT
BNDG COHESIVE 6X5 TAN STRL LF (GAUZE/BANDAGES/DRESSINGS) ×3 IMPLANT
BOWL CEMENT MIXING ADV NOZZLE (MISCELLANEOUS) IMPLANT
CANISTER SUCT 1200ML W/VALVE (MISCELLANEOUS) ×3 IMPLANT
CANISTER SUCT 3000ML PPV (MISCELLANEOUS) ×6 IMPLANT
CHLORAPREP W/TINT 26 (MISCELLANEOUS) ×6 IMPLANT
COVER WAND RF STERILE (DRAPES) ×3 IMPLANT
DECANTER SPIKE VIAL GLASS SM (MISCELLANEOUS) ×6 IMPLANT
DRAPE IMP U-DRAPE 54X76 (DRAPES) ×6 IMPLANT
DRAPE INCISE IOBAN 66X60 STRL (DRAPES) ×3 IMPLANT
DRAPE SHEET LG 3/4 BI-LAMINATE (DRAPES) ×3 IMPLANT
DRAPE SURG 17X11 SM STRL (DRAPES) ×3 IMPLANT
DRAPE SURG 17X23 STRL (DRAPES) ×3 IMPLANT
DRSG OPSITE POSTOP 4X12 (GAUZE/BANDAGES/DRESSINGS) ×3 IMPLANT
DRSG OPSITE POSTOP 4X14 (GAUZE/BANDAGES/DRESSINGS) ×3 IMPLANT
ELECT BLADE 6.5 EXT (BLADE) ×3 IMPLANT
ELECT CAUTERY BLADE 6.4 (BLADE) ×3 IMPLANT
ELECT REM PT RETURN 9FT ADLT (ELECTROSURGICAL) ×3
ELECTRODE REM PT RTRN 9FT ADLT (ELECTROSURGICAL) ×1 IMPLANT
GAUZE PACK 2X3YD (GAUZE/BANDAGES/DRESSINGS) IMPLANT
GLOVE BIO SURGEON STRL SZ8 (GLOVE) ×6 IMPLANT
GLOVE INDICATOR 8.0 STRL GRN (GLOVE) ×3 IMPLANT
GOWN STRL REUS W/ TWL LRG LVL3 (GOWN DISPOSABLE) ×1 IMPLANT
GOWN STRL REUS W/ TWL XL LVL3 (GOWN DISPOSABLE) ×1 IMPLANT
GOWN STRL REUS W/TWL LRG LVL3 (GOWN DISPOSABLE) ×2
GOWN STRL REUS W/TWL XL LVL3 (GOWN DISPOSABLE) ×2
HOOD PEEL AWAY FLYTE STAYCOOL (MISCELLANEOUS) ×6 IMPLANT
IV NS 100ML SINGLE PACK (IV SOLUTION) IMPLANT
LABEL OR SOLS (LABEL) ×3 IMPLANT
MAT ABSORB  FLUID 56X50 GRAY (MISCELLANEOUS) ×2
MAT ABSORB FLUID 56X50 GRAY (MISCELLANEOUS) ×1 IMPLANT
NDL SAFETY ECLIPSE 18X1.5 (NEEDLE) ×1 IMPLANT
NEEDLE FILTER BLUNT 18X 1/2SAF (NEEDLE) ×2
NEEDLE FILTER BLUNT 18X1 1/2 (NEEDLE) ×1 IMPLANT
NEEDLE HYPO 18GX1.5 SHARP (NEEDLE) ×2
NEEDLE SPNL 20GX3.5 QUINCKE YW (NEEDLE) ×3 IMPLANT
NS IRRIG 1000ML POUR BTL (IV SOLUTION) ×3 IMPLANT
PACK HIP PROSTHESIS (MISCELLANEOUS) ×3 IMPLANT
PULSAVAC PLUS IRRIG FAN TIP (DISPOSABLE) ×3
SOL .9 NS 3000ML IRR  AL (IV SOLUTION) ×4
SOL .9 NS 3000ML IRR UROMATIC (IV SOLUTION) ×2 IMPLANT
STAPLER SKIN PROX 35W (STAPLE) ×3 IMPLANT
STRAP SAFETY 5IN WIDE (MISCELLANEOUS) ×3 IMPLANT
SUT ETHIBOND 2 V 37 (SUTURE) ×9 IMPLANT
SUT VIC AB 1 CT1 36 (SUTURE) ×6 IMPLANT
SUT VIC AB 2-0 CT1 (SUTURE) ×9 IMPLANT
SUT VIC AB 2-0 CT1 27 (SUTURE) ×6
SUT VIC AB 2-0 CT1 TAPERPNT 27 (SUTURE) ×3 IMPLANT
SUT VICRYL 1-0 27IN ABS (SUTURE) ×6
SUTURE VICRYL 1-0 27IN ABS (SUTURE) ×2 IMPLANT
SYR 10ML LL (SYRINGE) ×3 IMPLANT
SYR 30ML LL (SYRINGE) ×9 IMPLANT
SYR TB 1ML 27GX1/2 LL (SYRINGE) IMPLANT
TAPE TRANSPORE STRL 2 31045 (GAUZE/BANDAGES/DRESSINGS) ×3 IMPLANT
TIP BRUSH PULSAVAC PLUS 24.33 (MISCELLANEOUS) ×3 IMPLANT
TIP FAN IRRIG PULSAVAC PLUS (DISPOSABLE) ×1 IMPLANT

## 2019-01-16 SURGICAL SUPPLY — 58 items
BLADE SAGITTAL AGGR TOOTH XLG (BLADE) ×3 IMPLANT
BNDG COHESIVE 6X5 TAN STRL LF (GAUZE/BANDAGES/DRESSINGS) ×9 IMPLANT
CANISTER SUCT 1200ML W/VALVE (MISCELLANEOUS) ×3 IMPLANT
CANISTER WOUND CARE 500ML ATS (WOUND CARE) ×3 IMPLANT
CHLORAPREP W/TINT 26 (MISCELLANEOUS) ×3 IMPLANT
COVER WAND RF STERILE (DRAPES) ×3 IMPLANT
DRAPE C-ARM XRAY 36X54 (DRAPES) ×3 IMPLANT
DRAPE INCISE IOBAN 66X60 STRL (DRAPES) IMPLANT
DRAPE POUCH INSTRU U-SHP 10X18 (DRAPES) ×3 IMPLANT
DRAPE SHEET LG 3/4 BI-LAMINATE (DRAPES) ×9 IMPLANT
DRAPE TABLE BACK 80X90 (DRAPES) ×3 IMPLANT
DRESSING SURGICEL FIBRLLR 1X2 (HEMOSTASIS) ×2 IMPLANT
DRSG OPSITE POSTOP 4X8 (GAUZE/BANDAGES/DRESSINGS) ×3 IMPLANT
DRSG SURGICEL FIBRILLAR 1X2 (HEMOSTASIS) ×6
ELECT BLADE 6.5 EXT (BLADE) ×3 IMPLANT
ELECT REM PT RETURN 9FT ADLT (ELECTROSURGICAL) ×3
ELECTRODE REM PT RTRN 9FT ADLT (ELECTROSURGICAL) ×1 IMPLANT
GLOVE BIOGEL PI IND STRL 9 (GLOVE) ×1 IMPLANT
GLOVE BIOGEL PI INDICATOR 9 (GLOVE) ×2
GLOVE SURG SYN 9.0  PF PI (GLOVE) ×4
GLOVE SURG SYN 9.0 PF PI (GLOVE) ×2 IMPLANT
GOWN SRG 2XL LVL 4 RGLN SLV (GOWNS) ×1 IMPLANT
GOWN STRL NON-REIN 2XL LVL4 (GOWNS) ×2
GOWN STRL REUS W/ TWL LRG LVL3 (GOWN DISPOSABLE) ×1 IMPLANT
GOWN STRL REUS W/TWL LRG LVL3 (GOWN DISPOSABLE) ×2
HEAD BIPOLAR SZ50 HEAD 28MM (Head) ×3 IMPLANT
HEMOVAC 400CC 10FR (MISCELLANEOUS) IMPLANT
HIP FEM HD M 28 (Head) ×3 IMPLANT
HOLDER FOLEY CATH W/STRAP (MISCELLANEOUS) ×3 IMPLANT
HOOD PEEL AWAY FLYTE STAYCOOL (MISCELLANEOUS) ×3 IMPLANT
KIT PREVENA INCISION MGT 13 (CANNISTER) IMPLANT
MAT ABSORB  FLUID 56X50 GRAY (MISCELLANEOUS) ×2
MAT ABSORB FLUID 56X50 GRAY (MISCELLANEOUS) ×1 IMPLANT
NDL SAFETY ECLIPSE 18X1.5 (NEEDLE) ×1 IMPLANT
NEEDLE HYPO 18GX1.5 SHARP (NEEDLE) ×2
NEEDLE SPNL 20GX3.5 QUINCKE YW (NEEDLE) ×6 IMPLANT
NS IRRIG 1000ML POUR BTL (IV SOLUTION) ×3 IMPLANT
PACK HIP COMPR (MISCELLANEOUS) ×3 IMPLANT
SCALPEL PROTECTED #10 DISP (BLADE) ×6 IMPLANT
SOL PREP PVP 2OZ (MISCELLANEOUS) ×3
SOLUTION PREP PVP 2OZ (MISCELLANEOUS) ×1 IMPLANT
SPONGE DRAIN TRACH 4X4 STRL 2S (GAUZE/BANDAGES/DRESSINGS) IMPLANT
STAPLER SKIN PROX 35W (STAPLE) ×3 IMPLANT
STEM FEMORAL 4 STD COLLARED (Stem) ×3 IMPLANT
STRAP SAFETY 5IN WIDE (MISCELLANEOUS) ×3 IMPLANT
SUT DVC 2 QUILL PDO  T11 36X36 (SUTURE) ×2
SUT DVC 2 QUILL PDO T11 36X36 (SUTURE) ×1 IMPLANT
SUT SILK 0 (SUTURE) ×2
SUT SILK 0 30XBRD TIE 6 (SUTURE) ×1 IMPLANT
SUT V-LOC 90 ABS DVC 3-0 CL (SUTURE) ×3 IMPLANT
SUT VIC AB 1 CT1 36 (SUTURE) ×3 IMPLANT
SYR 20CC LL (SYRINGE) ×3 IMPLANT
SYR 30ML LL (SYRINGE) ×3 IMPLANT
SYR 50ML LL SCALE MARK (SYRINGE) ×6 IMPLANT
SYR BULB IRRIG 60ML STRL (SYRINGE) ×3 IMPLANT
TAPE MICROFOAM 4IN (TAPE) IMPLANT
TOWEL OR 17X26 4PK STRL BLUE (TOWEL DISPOSABLE) ×3 IMPLANT
TRAY FOLEY MTR SLVR 16FR STAT (SET/KITS/TRAYS/PACK) ×3 IMPLANT

## 2019-01-16 NOTE — Anesthesia Post-op Follow-up Note (Signed)
Anesthesia QCDR form completed.        

## 2019-01-16 NOTE — Anesthesia Procedure Notes (Signed)
Spinal  Patient location during procedure: OR Start time: 01/16/2019 2:05 PM End time: 01/16/2019 2:29 PM Staffing Anesthesiologist: Martha Clan, MD Resident/CRNA: Ashaunti Treptow, Einar Grad, CRNA Performed: anesthesiologist and resident/CRNA  Preanesthetic Checklist Completed: patient identified, site marked, surgical consent, pre-op evaluation, timeout performed, IV checked, risks and benefits discussed and monitors and equipment checked Spinal Block Patient position: sitting Prep: ChloraPrep Patient monitoring: heart rate, cardiac monitor, continuous pulse ox and blood pressure Approach: midline Location: L3-4 Injection technique: single-shot Needle Needle type: Sprotte  Needle gauge: 24 G Needle length: 9 cm Assessment Sensory level: T4 Additional Notes Patient and kit checked prior to starting the procedure.  IV in place and working well.  Clear CSF return prior to injection of medication.  Injection went smoothly without complication.  Patient tolerated the procedure well.

## 2019-01-16 NOTE — Anesthesia Preprocedure Evaluation (Signed)
Anesthesia Evaluation  Patient identified by MRN, date of birth, ID band Patient awake    Reviewed: Allergy & Precautions, H&P , NPO status , Patient's Chart, lab work & pertinent test results, reviewed documented beta blocker date and time   History of Anesthesia Complications Negative for: history of anesthetic complications  Airway Mallampati: I  TM Distance: >3 FB Neck ROM: full    Dental  (+) Poor Dentition, Missing, Dental Advidsory Given, Edentulous Upper, Partial Lower   Pulmonary neg pulmonary ROS,           Cardiovascular Exercise Tolerance: Good hypertension, (-) angina(-) Past MI (-) dysrhythmias (-) Valvular Problems/Murmurs     Neuro/Psych PSYCHIATRIC DISORDERS Anxiety negative neurological ROS     GI/Hepatic GERD  ,(+)     substance abuse  alcohol use,   Endo/Other  negative endocrine ROS  Renal/GU negative Renal ROS  negative genitourinary   Musculoskeletal   Abdominal   Peds  Hematology negative hematology ROS (+)   Anesthesia Other Findings Past Medical History: No date: Alcohol use No date: Anxiety No date: GERD (gastroesophageal reflux disease) No date: Hypertension   Reproductive/Obstetrics negative OB ROS                             Anesthesia Physical Anesthesia Plan  ASA: II  Anesthesia Plan: Spinal   Post-op Pain Management:    Induction:   PONV Risk Score and Plan: 1 and Propofol infusion and TIVA  Airway Management Planned: Natural Airway and Nasal Cannula  Additional Equipment:   Intra-op Plan:   Post-operative Plan:   Informed Consent: I have reviewed the patients History and Physical, chart, labs and discussed the procedure including the risks, benefits and alternatives for the proposed anesthesia with the patient or authorized representative who has indicated his/her understanding and acceptance.     Dental Advisory Given  Plan  Discussed with: Anesthesiologist, CRNA and Surgeon  Anesthesia Plan Comments:         Anesthesia Quick Evaluation

## 2019-01-16 NOTE — Progress Notes (Addendum)
[place] [facility] [place] NAME: Joshua Lynn    MR#:  [id number]  DATE OF BIRTH:  [birth year]  SUBJECTIVE:  CHIEF COMPLAINT:  No chief complaint on file.  -Fall and right femoral neck fracture.  Going to the OR today.  Much more somnolent due to pain medications.  Spiked a fever last night.  REVIEW OF SYSTEMS:  Review of Systems  Constitutional: Positive for fever and malaise/fatigue. Negative for chills.  HENT: Negative for congestion, ear discharge, hearing loss and nosebleeds.   Eyes: Positive for blurred vision.  Respiratory: Negative for cough, shortness of breath and wheezing.   Cardiovascular: Negative for chest pain and palpitations.  Gastrointestinal: Negative for abdominal pain, constipation, diarrhea, nausea and vomiting.  Genitourinary: Negative for dysuria.  Musculoskeletal: Positive for joint pain and myalgias.  Neurological: Negative for dizziness, seizures and headaches.  Psychiatric/Behavioral: Negative for depression.    DRUG ALLERGIES:  No Known Allergies  VITALS:  Blood pressure (!) 147/79, pulse 77, temperature 99.5 F (37.5 C), temperature source Temporal, resp. rate 18, height 5\' 5"  (1.651 m), weight 67.1 kg, SpO2 96 %.  PHYSICAL EXAMINATION:  Physical Exam   GENERAL:  78 y.o.-year-old disheveled appearing patient lying in the bed with no acute distress.  EYES: Right eye is enucleated, left pupil equal, round, reactive to light and accommodation. No scleral icterus. Extraocular muscles intact.  HEENT: Head atraumatic, normocephalic. Oropharynx and nasopharynx clear.  NECK:  Supple, no jugular venous distention. No thyroid enlargement, no tenderness.  LUNGS: Normal breath sounds bilaterally, no wheezing, rales,rhonchi or crepitation. No use of accessory muscles of respiration.  Decreased bibasilar breath sounds CARDIOVASCULAR: S1, S2 normal. No murmurs, rubs, or gallops.  ABDOMEN: Soft, nontender, nondistended.  Bowel sounds present. No organomegaly or mass.  EXTREMITIES: No pedal edema, cyanosis, or clubbing.  No hand tremors today NEUROLOGIC: Cranial nerves II through XII are intact. Muscle strength 5/5 in all extremities except right lower extremity, limited motion due to pain. Sensation intact. Gait not checked.  PSYCHIATRIC: The patient is somnolent, easily arousable SKIN: No obvious rash, lesion, or ulcer.    LABORATORY PANEL:   CBC Recent Labs  Lab 01/15/19 0647  WBC 10.5  HGB 12.1*  HCT 35.2*  PLT 244   ------------------------------------------------------------------------------------------------------------------  Chemistries  Recent Labs  Lab 01/15/19 0647  NA 129*  K 3.7  CL 97*  CO2 19*  GLUCOSE 112*  BUN 12  CREATININE 0.93  CALCIUM 8.9   ------------------------------------------------------------------------------------------------------------------  Cardiac Enzymes No results for input(s): TROPONINI in the last 168 hours. ------------------------------------------------------------------------------------------------------------------  RADIOLOGY:  Dg Chest 1 View  Result Date: 01/14/2019 CLINICAL DATA:  Fall EXAM: CHEST  1 VIEW COMPARISON:  10/04/2018 FINDINGS: Heart and mediastinal contours are within normal limits. No focal opacities or effusions. No acute bony abnormality. IMPRESSION: No active disease. Electronically Signed   By: Rolm Baptise M.D.   On: 01/14/2019 19:46   Dg Hip Unilat W Or Wo Pelvis 2-3 Views Right  Result Date: 01/14/2019 CLINICAL DATA:  Fall EXAM: DG HIP (WITH OR WITHOUT PELVIS) 2-3V RIGHT COMPARISON:  None. FINDINGS: There is a transverse fracture of the right femoral neck with moderate foreshortening. Femoral head remains approximated with the acetabulum. No pelvic fracture. IMPRESSION: Transverse fracture of the right femoral neck. Electronically Signed   By: Ulyses Jarred M.D.   On: 01/14/2019 19:46    EKG:   Orders placed or  performed during the hospital encounter of 01/14/19  . ED  EKG  . ED EKG    ASSESSMENT AND PLAN:   78 year old male with past medical history significant for hypertension, GERD, anxiety, alcohol abuse presents to hospital secondary to a fall that he does not remember and noted to have right femoral neck fracture.  1.  Right femoral neck fracture-admitted, orthopedics consulted. -Plan for surgery today -Continue pain control.  Will need physical therapy and DVT prophylaxis after surgery  2.  Hyponatremia-continue IV fluids  3..  Alcohol abuse-monitor closely on CIWA protocol.  4.  DVT prophylaxis-will be started after surgery  5.  Fever of unknown origin-spiked a temperature 102 F last night.  COVID-19 test on admission was negative -Blood cultures have been ordered.  Check urine analysis and chest x-ray -No further fevers.  White count was normal yesterday.  Was empirically started on Rocephin.  Monitor closely.   All the records are reviewed and case discussed with Care Management/Social Workerr. Management plans discussed with the patient, family and they are in agreement.  CODE STATUS: Full code  TOTAL TIME TAKING CARE OF THIS PATIENT: 39 minutes.   POSSIBLE D/C IN 2-3 DAYS, DEPENDING ON CLINICAL CONDITION.   Gladstone Lighter M.D on 01/16/2019 at 1:37 PM  Between 7am to 6pm - Pager - [phone]  After 6pm go to [email or web address] - password [email or web address] [facility]  Office  [phone]  CC: Primary care physician; Administration, [employer]

## 2019-01-16 NOTE — Op Note (Signed)
01/16/2019  3:38 PM  PATIENT:  Joshua Lynn  78 y.o. male  PRE-OPERATIVE DIAGNOSIS:  RIGHT HIP FRACUTRE  POST-OPERATIVE DIAGNOSIS:  RIGHT HIP FRACUTRE  PROCEDURE:  Procedure(s): ANTERIOR APPROACH HEMI HIP ARTHROPLASTY (Right)  SURGEON: Laurene Footman, MD  ASSISTANTS: none  ANESTHESIA:   spinal  EBL:  Total I/O In: 3127.3 [I.V.:2927.3; IV Piggyback:200] Out: 250 [Urine:50; Blood:200]  BLOOD ADMINISTERED:none  DRAINS: none   LOCAL MEDICATIONS USED:  MARCAINE    and OTHER Exparel  SPECIMEN:  Source of Specimen:  Right femoral head and neck fragments  DISPOSITION OF SPECIMEN:  PATHOLOGY  COUNTS:  YES  TOURNIQUET:  * No tourniquets in log *  IMPLANTS: Medacta AMIS 4 metal M 28 mm head and 50 mm bipolar component  DICTATION: .Dragon Dictation   The patient was brought to the operating room and after spinal anesthesia was obtained the [patient as was placed on the operative table with the ipsilateral foot into the Medacta attachment, contralateral leg on a well-padded table. C-arm was brought in and preop template x-ray taken. After prepping and draping in usual sterile fashion appropriate patient identification and timeout procedures were completed. Anterior approach to the hip was obtained and centered over the greater trochanter and TFL muscle. The subcutaneous tissue was incised hemostasis being achieved by electrocautery. TFL fascia was incised and the muscle retracted laterally deep retractor placed. The lateral femoral circumflex vessels were identified and ligated. The anterior capsule was exposed and a capsulotomy performed. The neck was identified and a femoral neck cut carried out with a saw. The head was removed without difficulty and showed very little in the way of arthritis in the head sized to 50-50 trial fit well.. The leg was then externally rotated and ischiofemoral and pubofemoral releases carried out. The femur was sequentially broached to a size 4, this was then  examined under fluoroscopy and based on where the broach sat and the neck length a millimeter head was chosen.  The 4 standard stem was inserted along with a metal M 28 mm head and 50 mm bipolar r. The hip was reduced and was stable the wound was thoroughly irrigated with fibrillar placed along the posterior capsule and medial neck.  Exparel was injected throughout the case to aid in postop analgesia the deep fascia ws closed using a heavy Quill after infiltration of 30 cc of quarter percent Sensorcaine with epinephrine.3-0 V-loc to close the skin with skin staples. Xeroform and honeycomb dressing applied patient was sent to recovery in stable condition.   PLAN OF CARE: Continue as inpatient

## 2019-01-16 NOTE — Transfer of Care (Signed)
Immediate Anesthesia Transfer of Care Note  Patient: Joshua Lynn  Procedure(s) Performed: ANTERIOR APPROACH HEMI HIP ARTHROPLASTY (Right Hip)  Patient Location: PACU  Anesthesia Type:Spinal  Level of Consciousness: sedated  Airway & Oxygen Therapy: Patient Spontanous Breathing and Patient connected to face mask oxygen  Post-op Assessment: Report given to RN and Post -op Vital signs reviewed and stable  Post vital signs: Reviewed and stable  Last Vitals:  Vitals Value Taken Time  BP    Temp    Pulse 79 01/16/19 1534  Resp 14 01/16/19 1534  SpO2 100 % 01/16/19 1534  Vitals shown include unvalidated device data.  Last Pain:  Vitals:   01/16/19 1324  TempSrc: Temporal  PainSc: 2       Patients Stated Pain Goal: 0 (54/86/28 2417)  Complications: No apparent anesthesia complications

## 2019-01-16 NOTE — OR Nursing (Signed)
Patient is somnulent but does respond to name and is able to answer questions.

## 2019-01-16 NOTE — TOC Progression Note (Signed)
Transition of Care [facility]) - Progression Note    Patient Details  Name: Joshua Lynn MRN: [id number] Date of Birth: 03/05/1941  Transition of Care [facility] CM/SW Contact  Kaiyan Luczak, Lenice Llamas Phone Number: [phone]  01/16/2019, 3:56 PM  Clinical Narrative: Gwendalyn Ege has been received [id number] E, expires 02/15/2019.         Expected Discharge Plan: [place] Barriers to Discharge: Continued Medical Work up  Expected Discharge Plan and Services Expected Discharge Plan: [place] In-house Referral: Clinical Social Work Discharge Planning Services: CM Consult   Living arrangements for the past 2 months: Single Family Home                                       Social Determinants of Health (SDOH) Interventions    Readmission Risk Interventions No flowsheet data found.

## 2019-01-17 ENCOUNTER — Inpatient Hospital Stay: Payer: Medicare Other

## 2019-01-17 ENCOUNTER — Encounter: Payer: Self-pay | Admitting: Orthopedic Surgery

## 2019-01-17 LAB — BASIC METABOLIC PANEL
Anion gap: 7 (ref 5–15)
BUN: 19 mg/dL (ref 8–23)
CO2: 21 mmol/L — ABNORMAL LOW (ref 22–32)
Calcium: 7.9 mg/dL — ABNORMAL LOW (ref 8.9–10.3)
Chloride: 103 mmol/L (ref 98–111)
Creatinine, Ser: 0.92 mg/dL (ref 0.61–1.24)
GFR calc Af Amer: >60 mL/min (ref >60)
GFR calc non Af Amer: >60 mL/min (ref >60)
Glucose, Bld: 110 mg/dL — ABNORMAL HIGH (ref 70–99)
Potassium: 3.5 mmol/L (ref 3.5–5.1)
Sodium: 131 mmol/L — ABNORMAL LOW (ref 135–145)

## 2019-01-17 LAB — CBC
HCT: 28.5 % — ABNORMAL LOW (ref 39.0–52.0)
Hemoglobin: 9.8 g/dL — ABNORMAL LOW (ref 13.0–17.0)
MCH: 31.2 pg (ref 26.0–34.0)
MCHC: 34.4 g/dL (ref 30.0–36.0)
MCV: 90.8 fL (ref 80.0–100.0)
Platelets: 197 10*3/uL (ref 150–400)
RBC: 3.14 MIL/uL — ABNORMAL LOW (ref 4.22–5.81)
RDW: 13.2 % (ref 11.5–15.5)
WBC: 11.1 10*3/uL — ABNORMAL HIGH (ref 4.0–10.5)
nRBC: 0 % (ref 0.0–0.2)

## 2019-01-17 MED ORDER — HYDROCODONE-ACETAMINOPHEN 5-325 MG PO TABS: 1.0000 | ORAL_TABLET | ORAL | 0 refills | Status: DC | PRN

## 2019-01-17 MED ORDER — ALUM & MAG HYDROXIDE-SIMETH 200-200-20 MG/5ML PO SUSP: 30.0000 mL | ORAL | 0 refills | Status: DC | PRN

## 2019-01-17 MED ORDER — SODIUM CHLORIDE 0.9 % IV SOLN: 1.0000 g | INTRAVENOUS | Status: DC

## 2019-01-17 MED ORDER — MAGNESIUM HYDROXIDE 400 MG/5ML PO SUSP: 30.0000 mL | Freq: Every day | ORAL | 0 refills | Status: DC | PRN

## 2019-01-17 MED ORDER — ONDANSETRON HCL 4 MG/2ML IJ SOLN: 4.0000 mg | Freq: Four times a day (QID) | INTRAMUSCULAR | 0 refills | Status: DC | PRN

## 2019-01-17 MED ORDER — METHOCARBAMOL 500 MG PO TABS: 500.0000 mg | ORAL_TABLET | Freq: Four times a day (QID) | ORAL | Status: DC | PRN

## 2019-01-17 NOTE — Progress Notes (Signed)
PT Cancellation Note  Patient Details Name: Joshua Lynn MRN: [id number] DOB: 12-24-1940   Cancelled Treatment:    Reason Eval/Treat Not Completed: Fatigue/lethargy limiting ability to participate(Consult received and chart reviewed.  Patient sleeping soundly; minimally rouses to sternal rub, but unable to maintain for appropriate, active participation with therapy session.  RN informed/aware.  Will continue efforts in PM as appropriate.)   Afua Hoots H. Owens Shark, PT, DPT, NCS 01/17/19, 10:21 AM [phone]

## 2019-01-17 NOTE — Progress Notes (Addendum)
[place] at [place] NAME: Joshua Lynn    MR#:  [id number]  DATE OF BIRTH:  01-Jun-1941  SUBJECTIVE:  CHIEF COMPLAINT:  No chief complaint on file.  -Fall and right femoral neck fracture.  Status post surgery and postop day 1 today. -Patient is pretty lethargic this morning.  Arousable and answers to simple questions.  Received Ativan this morning for withdrawals  REVIEW OF SYSTEMS:  Review of Systems  Unable to perform ROS: Mental status change    DRUG ALLERGIES:  No Known Allergies  VITALS:  Blood pressure 113/69, pulse 94, temperature (!) 96.6 F (35.9 C), temperature source Axillary, resp. rate 18, height 5\' 5"  (1.651 m), weight 67.1 kg, SpO2 100 %.  PHYSICAL EXAMINATION:  Physical Exam   GENERAL:  78 y.o.-year-old disheveled appearing patient lying in the bed, sleeping soundly EYES: Right eye is enucleated, left pupil equal, round, reactive to light and accommodation. No scleral icterus. Extraocular muscles intact.  HEENT: Head atraumatic, normocephalic. Oropharynx and nasopharynx clear.  NECK:  Supple, no jugular venous distention. No thyroid enlargement, no tenderness.  LUNGS: Normal breath sounds bilaterally, no wheezing, rales,rhonchi or crepitation. No use of accessory muscles of respiration.  Decreased bibasilar breath sounds CARDIOVASCULAR: S1, S2 normal. No murmurs, rubs, or gallops.  ABDOMEN: Soft, nontender, nondistended. Bowel sounds present. No organomegaly or mass.  EXTREMITIES: No pedal edema, cyanosis, or clubbing.  No hand tremors -Dressing in place to the right lateral hip NEUROLOGIC: Cranial nerves II through XII are intact. Muscle strength 5/5 in all extremities except right lower extremity, limited motion due to pain. Sensation intact. Gait not checked.  PSYCHIATRIC: The patient is somnolent, easily arousable-answer simple questions. SKIN: No obvious rash, lesion, or ulcer.    LABORATORY PANEL:   CBC  Recent Labs  Lab 01/17/19 0640  WBC 11.1*  HGB 9.8*  HCT 28.5*  PLT 197   ------------------------------------------------------------------------------------------------------------------  Chemistries  Recent Labs  Lab 01/17/19 0640  NA 131*  K 3.5  CL 103  CO2 21*  GLUCOSE 110*  BUN 19  CREATININE 0.92  CALCIUM 7.9*   ------------------------------------------------------------------------------------------------------------------  Cardiac Enzymes No results for input(s): TROPONINI in the last 168 hours. ------------------------------------------------------------------------------------------------------------------  RADIOLOGY:  Dg Hip Operative Unilat W Or W/o Pelvis Right  Result Date: 01/16/2019 CLINICAL DATA:  Hip arthroplasty EXAM: OPERATIVE right HIP (WITH PELVIS IF PERFORMED) 1 VIEWS TECHNIQUE: Fluoroscopic spot image(s) were submitted for interpretation post-operatively. COMPARISON:  01/14/2019 FINDINGS: Single low resolution intraoperative spot view of the right hip. Total fluoroscopy time was 12 seconds. The image demonstrates a right hip replacement with normal alignment IMPRESSION: Intraoperative fluoroscopic assistance provided during right hip replacement. Electronically Signed   By: Donavan Foil M.D.   On: 01/16/2019 15:45   Dg Hip Unilat W Or W/o Pelvis 2-3 Views Right  Result Date: 01/16/2019 CLINICAL DATA:  Fracture of the proximal right femur. EXAM: DG HIP (WITH OR WITHOUT PELVIS) 2-3V RIGHT COMPARISON:  01/14/2019 FINDINGS: Right hip arthroplasty is located. The entire femoral stem is visualized. Lucency at the right greater trochanter is likely representing postoperative change. Surgical skin staples are present. IMPRESSION: Expected changes from a right hip arthroplasty. Electronically Signed   By: Markus Daft M.D.   On: 01/16/2019 16:37    EKG:   Orders placed or performed during the hospital encounter of 01/14/19  . ED EKG  . ED EKG     ASSESSMENT AND PLAN:   78 year old male with past medical  history significant for hypertension, GERD, anxiety, alcohol abuse presents to hospital secondary to a fall that he does not remember and noted to have right femoral neck fracture.  1.  Right femoral neck fracture-admitted, orthopedics consult appreciated. -Status post surgery and postop day 1 today -Continue pain control.   -Physical therapy to be starting today  2.  Hyponatremia-improved with IV fluids  3..  Alcohol abuse-monitor closely on CIWA protocol.  4.  Altered mental status-very somnolent secondary to Ativan early this morning.  Going through withdrawals.  Monitor closely.  If no improvement, hold Ativan and pain medication.  No focal deficits. -We will get a CT of his head due to a prior CT head 10 days ago after a fall showing a very small subdural hematoma.  4.  DVT prophylaxis-is started on Lovenox  5.  Fever of unknown origin-currently started on Rocephin.  Urine analysis is pending.  Cultures remain negative.  Next x-ray on admission was negative as well. -Stop Rocephin after 3 days treatment.    All the records are reviewed and case discussed with Care Management/Social Workerr. Management plans discussed with the patient, family and they are in agreement.  CODE STATUS: Full code  TOTAL TIME TAKING CARE OF THIS PATIENT: 39 minutes.   POSSIBLE D/C IN 2-3 DAYS, DEPENDING ON CLINICAL CONDITION.   Gladstone Lighter M.D on 01/17/2019 at 10:44 AM  Between 7am to 6pm - Pager - [phone]  After 6pm go to [email or web address] - password [email or web address] [facility]  Office  [phone]  CC: Primary care physician; Administration, [employer]

## 2019-01-17 NOTE — Discharge Summary (Signed)
[facility] NAME: Joshua Lynn    MR#:  [id number]  DATE OF BIRTH:  May 13, 1941  DATE OF ADMISSION:  01/14/2019   ADMITTING PHYSICIAN: Lance Coon, MD  DATE OF DISCHARGE: 01/17/2019  PRIMARY CARE PHYSICIAN: [facility], [facility]   ADMISSION DIAGNOSIS:   Fall [W19.XXXA] Closed fracture of right hip, initial encounter ([place]) [S72.001A]  DISCHARGE DIAGNOSIS:   Principal Problem:   Closed right hip fracture ([place]) Active Problems:   Alcohol abuse   HTN (hypertension)   Anxiety   SECONDARY DIAGNOSIS:   Past Medical History:  Diagnosis Date   Alcohol use    Anxiety    GERD (gastroesophageal reflux disease)    Hypertension     HOSPITAL COURSE:   *78 year old male with past medical history significant for hypertension, GERD, anxiety, alcohol abuse presents to hospital secondary to a fall that he does not remember and noted to have right femoral neck fracture.  1.  Right femoral neck fracture-admitted, orthopedics consult appreciated. -Status post surgery and postop day 1 today -Continue pain control.   -Physical therapy twill be on hold now until cleared by Neurosurgery  2.  Hyponatremia-improved with IV fluids  3..  Alcohol abuse-monitor closely on CIWA protocol.  4.  Altered mental status-very somnolent - initially thought to be from  Ativan received early this morning. It was given for possible  withdrawals. - CT head was ordered today showing increase in sub dural hematoma on left convexity- of 69mm. There is no midline shift. Patient had a CT head on 01/07/19 in ED after a fall and it showed 58mm subdural hematoma. -Due to increase in the size of subdural hematoma and altered mental status- neuro surgery was consulted. Dr. Meade Maw has kindly reviewed the films and recommended transfer to [facility] for closer observation and intervention if needed.  4.  DVT prophylaxis-Lovenox on hold due to sub  dural hematoma  5.  Fever of unknown origin- had a one time fever of 102F, and empirically  started on Rocephin on 01/16/19- stop after 3 days.  Urine analysis is pending. blood Cultures remain negative.  chest x-ray on admission was negative as well. -could be neurogenic given hematoma?   Patient will be transferred to [facility] when bed available.  DISCHARGE CONDITIONS:   Critical  CONSULTS OBTAINED:   Treatment Team:  Poggi, Marshall Cork, MD Hessie Knows, MD  Neuro surgery by Dr. Meade Maw  DRUG ALLERGIES:   No Known Allergies DISCHARGE MEDICATIONS:   Allergies as of 01/17/2019   No Known Allergies     Medication List    STOP taking these medications   predniSONE 20 MG tablet Commonly known as: Deltasone   tiZANidine 2 MG tablet Commonly known as: ZANAFLEX     TAKE these medications   alum & mag hydroxide-simeth 200-200-20 MG/5ML suspension Commonly known as: MAALOX/MYLANTA Take 30 mLs by mouth every 4 (four) hours as needed for indigestion.   cefTRIAXone 1 g in sodium chloride 0.9 % 100 mL Inject 1 g into the vein daily. Start taking on: January 18, 2019   doxazosin 4 MG tablet Commonly known as: CARDURA Take 4 mg by mouth at bedtime.   HYDROcodone-acetaminophen 5-325 MG tablet Commonly known as: NORCO/VICODIN Take 1-2 tablets by mouth every 4 (four) hours as needed for moderate pain (pain score 4-6).   magnesium hydroxide 400 MG/5ML suspension Commonly known as: MILK OF MAGNESIA Take 30 mLs by mouth daily as needed for mild  constipation.   methocarbamol 500 MG tablet Commonly known as: ROBAXIN Take 1 tablet (500 mg total) by mouth every 6 (six) hours as needed for muscle spasms.   ondansetron 4 MG/2ML Soln injection Commonly known as: ZOFRAN Inject 2 mLs (4 mg total) into the vein every 6 (six) hours as needed for nausea.            Durable Medical Equipment  (From admission, onward)         Start     Ordered   01/16/19 1658  DME Walker  rolling  Once    Question:  Patient needs a walker to treat with the following condition  Answer:  Status post hip hemiarthroplasty   01/16/19 1658   01/16/19 1658  DME 3 n 1  Once     01/16/19 1658   01/16/19 1658  DME Bedside commode  Once    Question:  Patient needs a bedside commode to treat with the following condition  Answer:  Status post hip hemiarthroplasty   01/16/19 1658           DISCHARGE INSTRUCTIONS:   Patient being transferred to [facility] neurosurgery service  DIET:   Cardiac diet  ACTIVITY:   Bedrest until evaluated by neuro surgery  OXYGEN:   Home Oxygen: No Oxygen Delivery: Room air  DISCHARGE LOCATION:   [facility]  If you experience worsening of your admission symptoms, develop shortness of breath, life threatening emergency, suicidal or homicidal thoughts you must seek medical attention immediately by calling 911 or calling your MD immediately  if symptoms less severe.  You Must read complete instructions/literature along with all the possible adverse reactions/side effects for all the Medicines you take and that have been prescribed to you. Take any new Medicines after you have completely understood and accpet all the possible adverse reactions/side effects.   Please note  You were cared for by a hospitalist during your hospital stay. If you have any questions about your discharge medications or the care you received while you were in the hospital after you are discharged, you can call the unit and asked to speak with the hospitalist on call if the hospitalist that took care of you is not available. Once you are discharged, your primary care physician will handle any further medical issues. Please note that NO REFILLS for any discharge medications will be authorized once you are discharged, as it is imperative that you return to your primary care physician (or establish a relationship with a primary care physician if you do not have one) for your  aftercare needs so that they can reassess your need for medications and monitor your lab values.    On the day of Discharge:  VITAL SIGNS:   Blood pressure 128/73, pulse 85, temperature 98.2 F (36.8 C), temperature source Oral, resp. rate 18, height 5\' 5"  (1.651 m), weight 67.1 kg, SpO2 97 %.  PHYSICAL EXAMINATION:    GENERAL:  78 y.o.-year-old disheveled appearing patient lying in the bed, sleeping soundly EYES: Right eye is enucleated, left pupil equal, round, reactive to light and accommodation. No scleral icterus. Extraocular muscles intact.  HEENT: Head atraumatic, normocephalic. Oropharynx and nasopharynx clear.  NECK:  Supple, no jugular venous distention. No thyroid enlargement, no tenderness.  LUNGS: Normal breath sounds bilaterally, no wheezing, rales,rhonchi or crepitation. No use of accessory muscles of respiration.  Decreased bibasilar breath sounds CARDIOVASCULAR: S1, S2 normal. No murmurs, rubs, or gallops.  ABDOMEN: Soft, nontender, nondistended. Bowel sounds present. No  organomegaly or mass.  EXTREMITIES: No pedal edema, cyanosis, or clubbing.  No hand tremors -Dressing in place to the right lateral hip NEUROLOGIC: Cranial nerves II through XII are intact. Muscle strength 5/5 in all extremities except right lower extremity, limited motion due to pain. Sensation intact. Gait not checked.  PSYCHIATRIC: The patient is somnolent, easily arousable-answer simple questions. SKIN: No obvious rash, lesion, or ulcer.   DATA REVIEW:   CBC Recent Labs  Lab 01/17/19 0640  WBC 11.1*  HGB 9.8*  HCT 28.5*  PLT 197    Chemistries  Recent Labs  Lab 01/17/19 0640  NA 131*  K 3.5  CL 103  CO2 21*  GLUCOSE 110*  BUN 19  CREATININE 0.92  CALCIUM 7.9*     Microbiology Results  Results for orders placed or performed during the hospital encounter of 01/14/19  SARS Coronavirus 2 (CEPHEID- Performed in [place] hospital lab), Hosp Order     Status: None   Collection  Time: 01/14/19  8:21 PM   Specimen: Nasopharyngeal Swab  Result Value Ref Range Status   SARS Coronavirus 2 NEGATIVE NEGATIVE Final    Comment: (NOTE) If result is NEGATIVE SARS-CoV-2 target nucleic acids are NOT DETECTED. The SARS-CoV-2 RNA is generally detectable in upper and lower  respiratory specimens during the acute phase of infection. The lowest  concentration of SARS-CoV-2 viral copies this assay can detect is 250  copies / mL. A negative result does not preclude SARS-CoV-2 infection  and should not be used as the sole basis for treatment or other  patient management decisions.  A negative result may occur with  improper specimen collection / handling, submission of specimen other  than nasopharyngeal swab, presence of viral mutation(s) within the  areas targeted by this assay, and inadequate number of viral copies  (<250 copies / mL). A negative result must be combined with clinical  observations, patient history, and epidemiological information. If result is POSITIVE SARS-CoV-2 target nucleic acids are DETECTED. The SARS-CoV-2 RNA is generally detectable in upper and lower  respiratory specimens dur ing the acute phase of infection.  Positive  results are indicative of active infection with SARS-CoV-2.  Clinical  correlation with patient history and other diagnostic information is  necessary to determine patient infection status.  Positive results do  not rule out bacterial infection or co-infection with other viruses. If result is PRESUMPTIVE POSTIVE SARS-CoV-2 nucleic acids MAY BE PRESENT.   A presumptive positive result was obtained on the submitted specimen  and confirmed on repeat testing.  While 2019 novel coronavirus  (SARS-CoV-2) nucleic acids may be present in the submitted sample  additional confirmatory testing may be necessary for epidemiological  and / or clinical management purposes  to differentiate between  SARS-CoV-2 and other Sarbecovirus currently known  to infect humans.  If clinically indicated additional testing with an alternate test  methodology [phone]) is advised. The SARS-CoV-2 RNA is generally  detectable in upper and lower respiratory sp ecimens during the acute  phase of infection. The expected result is Negative. Fact Sheet for Patients:  StrictlyIdeas.no Fact Sheet for Healthcare Providers: BankingDealers.co.za This test is not yet approved or cleared by the [place] FDA and has been authorized for detection and/or diagnosis of SARS-CoV-2 by FDA under an Emergency Use Authorization (EUA).  This EUA will remain in effect (meaning this test can be used) for the duration of the COVID-19 declaration under Section 564(b)(1) of the Act, 21 U.S.C. section 360bbb-3(b)(1), unless the authorization  is terminated or revoked sooner. Performed at [facility], [address] [place], [place] [address]   MRSA PCR Screening     Status: None   Collection Time: 01/14/19 11:58 PM   Specimen: Nasal Mucosa; Nasopharyngeal  Result Value Ref Range Status   MRSA by PCR NEGATIVE NEGATIVE Final    Comment:        The GeneXpert MRSA Assay (FDA approved for NASAL specimens only), is one component of a comprehensive MRSA colonization surveillance program. It is not intended to diagnose MRSA infection nor to guide or monitor treatment for MRSA infections. Performed at [facility], [address] [place], [place] [address]   Culture, blood (Routine X 2) w Reflex to ID Panel     Status: None (Preliminary result)   Collection Time: 01/16/19 12:36 AM   Specimen: BLOOD  Result Value Ref Range Status   Specimen Description BLOOD BLOOD LEFT FOREARM  Final   Special Requests   Final    BOTTLES DRAWN AEROBIC AND ANAEROBIC Blood Culture adequate volume   Culture   Final    NO GROWTH 1 DAY Performed at [facility], [address] [place], [place] [address]     Report Status PENDING  Incomplete  Culture, blood (Routine X 2) w Reflex to ID Panel     Status: None (Preliminary result)   Collection Time: 01/16/19 12:41 AM   Specimen: BLOOD  Result Value Ref Range Status   Specimen Description BLOOD BLOOD LEFT HAND  Final   Special Requests   Final    BOTTLES DRAWN AEROBIC AND ANAEROBIC Blood Culture adequate volume   Culture   Final    NO GROWTH 1 DAY Performed at [facility], [address] [place], [place] [address]    Report Status PENDING  Incomplete    RADIOLOGY:  Ct Head Wo Contrast  Result Date: 01/17/2019 CLINICAL DATA:  Re-evaluate subdural hemorrhage EXAM: CT HEAD WITHOUT CONTRAST TECHNIQUE: Contiguous axial images were obtained from the base of the skull through the vertex without intravenous contrast. COMPARISON:  01/07/2019 FINDINGS: Brain: Slight interval increase in size of a left subdural hematoma, which contains layering dependent blood product and now measures approximately 13 mm in maximum coronal thickness, previously 10 mm. There is no significant midline shift. Extensive periventricular white matter hypodensity and global volume loss. Vascular: No hyperdense vessel or unexpected calcification. Skull: Normal. Negative for fracture or focal lesion. Sinuses/Orbits: No acute finding.  Right ocular prosthesis. Other: None. IMPRESSION: 1. Slight interval increase in size of a left subdural hematoma, which contains layering dependent blood product and now measures approximately 13 mm in maximum coronal thickness, previously 10 mm. There is no significant midline shift. 2.  Underlying small-vessel white matter disease. These results will be called to the ordering clinician or representative by the Radiologist Assistant, and communication documented in the PACS or zVision Dashboard. Electronically Signed   By: Eddie Candle M.D.   On: 01/17/2019 12:21     Management plans discussed with the patient, family and they are in  agreement.  CODE STATUS:     Code Status Orders  (From admission, onward)         Start     Ordered   01/14/19 2241  Full code  Continuous     01/14/19 2240        Code Status History    Date Active Date Inactive Code Status Order ID Comments User Context   01/07/2019 1517 01/07/2019  2318 Full Code [id number]  Merlyn Lot, MD ED   Advance Care Planning Activity      TOTAL TIME TAKING CARE OF THIS PATIENT: 39 minutes.    Gladstone Lighter M.D on 01/17/2019 at 4:31 PM  Between 7am to 6pm - Pager - [phone]  After 6pm go to [email or web address] - Technical brewer  Hospitalists  Office  [phone]  CC: Primary care physician; Administration, Veterans   Note: This dictation was prepared with Diplomatic Services operational officer dictation along with smaller Company secretary. Any transcriptional errors that result from this process are unintentional.

## 2019-01-17 NOTE — Consult Note (Signed)
Referring Physician:  No referring provider defined for this encounter.  Primary Physician:  Administration, Veterans  Chief Complaint:  Subdural hematoma  History of Present Illness: Joshua Lynn is a 78 y.o. male who presents as a consult for subdural hematoma. Familiar to service with previous evaluation by Dr. Lacinda Axon on 01/07/2019 in the ED for subdural hematoma after a fall.  Neurologically intact during exam on that date.  Head CT was repeated without any changes noted.  Presented again to the ED 3 days ago after experiencing a fall with complaints of right hip pain.  Imaging significant for displaced right femoral neck fracture.  He was admitted and underwent hemi-hip arthroplasty on 01/16/2019 with spinal anesthesia.  Noted irritability in the emergency department and poor historian noted during evaluation in ED. Floor staff also notes irritability through last night. History of significant alcohol use noted, CIWA protocol initiated.  Last Ativan dose received orally at 5:11AM 01/17/2019.  Noted increased difficulty with arousal through this morning.  Head CT was completed and significant findings include increase in the left frontal subdural hematoma (now 47mm, previously 63mm).  On evaluation, patient was responsive to voice but was very lethargic and would only stay awake for brief periods of time.  Denied headache, but complained of nausea.  Difficult to obtain any other information from him.  Review of Systems:  A 10 point review of systems is negative, except for the pertinent positives and negatives detailed in the HPI.  Past Medical History: Past Medical History:  Diagnosis Date  . Alcohol use   . Anxiety   . GERD (gastroesophageal reflux disease)   . Hypertension     Past Surgical History: Past Surgical History:  Procedure Laterality Date  . ANTERIOR APPROACH HEMI HIP ARTHROPLASTY Right 01/16/2019   Procedure: ANTERIOR APPROACH HEMI HIP ARTHROPLASTY;  Surgeon: Hessie Knows, MD;  Location: [facility] ORS;  Service: Orthopedics;  Laterality: Right;  . RIGHT eye extraction       Allergies: Allergies as of 01/14/2019  . (No Known Allergies)    Medications:  Current Facility-Administered Medications:  .  0.9 %  sodium chloride infusion, , Intravenous, Continuous, Hessie Knows, MD, Last Rate: 100 mL/hr at 01/17/19 1508 .  acetaminophen (TYLENOL) tablet 650 mg, 650 mg, Oral, Q6H PRN, 650 mg at 01/16/19 0121 **OR** acetaminophen (TYLENOL) suppository 650 mg, 650 mg, Rectal, Q6H PRN, Hessie Knows, MD .  acetaminophen (TYLENOL) tablet 500 mg, 500 mg, Oral, Q6H, Hessie Knows, MD, 500 mg at 01/17/19 1510 .  alum & mag hydroxide-simeth (MAALOX/MYLANTA) 200-200-20 MG/5ML suspension 30 mL, 30 mL, Oral, Q4H PRN, Hessie Knows, MD .  bisacodyl (DULCOLAX) suppository 10 mg, 10 mg, Rectal, Daily PRN, Hessie Knows, MD .  cefTRIAXone (ROCEPHIN) 1 g in sodium chloride 0.9 % 100 mL IVPB, 1 g, Intravenous, Q24H, Gladstone Lighter, MD, Stopped at 01/17/19 0139 .  diphenhydrAMINE (BENADRYL) 12.5 MG/5ML elixir 12.5-25 mg, 12.5-25 mg, Oral, Q4H PRN, Hessie Knows, MD, 25 mg at 01/16/19 2029 .  docusate sodium (COLACE) capsule 100 mg, 100 mg, Oral, BID, Hessie Knows, MD, 100 mg at 01/16/19 2029 .  doxazosin (CARDURA) tablet 4 mg, 4 mg, Oral, QHS, Hessie Knows, MD, 4 mg at 01/16/19 2030 .  folic acid (FOLVITE) tablet 1 mg, 1 mg, Oral, Daily, Hessie Knows, MD, 1 mg at 01/17/19 1510 .  HYDROcodone-acetaminophen (NORCO) 7.5-325 MG per tablet 1-2 tablet, 1-2 tablet, Oral, Q4H PRN, Hessie Knows, MD, 2 tablet at 01/16/19 2029 .  HYDROcodone-acetaminophen (NORCO/VICODIN) 5-325 MG  per tablet 1-2 tablet, 1-2 tablet, Oral, Q4H PRN, Hessie Knows, MD .  labetalol (NORMODYNE) injection 10 mg, 10 mg, Intravenous, Q2H PRN, Hessie Knows, MD .  [EXPIRED] LORazepam (ATIVAN) injection 0-4 mg, 0-4 mg, Intravenous, Q6H **FOLLOWED BY** LORazepam (ATIVAN) injection 0-4 mg, 0-4 mg, Intravenous, Q12H,  Hessie Knows, MD, 2 mg at 01/17/19 0043 .  LORazepam (ATIVAN) tablet 1 mg, 1 mg, Oral, Q6H PRN, 1 mg at 01/17/19 0511 **OR** LORazepam (ATIVAN) injection 1 mg, 1 mg, Intravenous, Q6H PRN, Hessie Knows, MD, 1 mg at 01/15/19 2223 .  magnesium citrate solution 1 Bottle, 1 Bottle, Oral, Once PRN, Hessie Knows, MD .  magnesium hydroxide (MILK OF MAGNESIA) suspension 30 mL, 30 mL, Oral, Daily PRN, Hessie Knows, MD .  menthol-cetylpyridinium (CEPACOL) lozenge 3 mg, 1 lozenge, Oral, PRN **OR** phenol (CHLORASEPTIC) mouth spray 1 spray, 1 spray, Mouth/Throat, PRN, Hessie Knows, MD .  methocarbamol (ROBAXIN) tablet 500 mg, 500 mg, Oral, Q6H PRN, 500 mg at 01/16/19 2029 **OR** methocarbamol (ROBAXIN) 500 mg in dextrose 5 % 50 mL IVPB, 500 mg, Intravenous, Q6H PRN, Hessie Knows, MD .  metoCLOPramide (REGLAN) tablet 5-10 mg, 5-10 mg, Oral, Q8H PRN **OR** metoCLOPramide (REGLAN) injection 5-10 mg, 5-10 mg, Intravenous, Q8H PRN, Hessie Knows, MD .  morphine 2 MG/ML injection 0.5-1 mg, 0.5-1 mg, Intravenous, Q2H PRN, Hessie Knows, MD .  multivitamin with minerals tablet 1 tablet, 1 tablet, Oral, Daily, Hessie Knows, MD .  ondansetron (ZOFRAN) tablet 4 mg, 4 mg, Oral, Q6H PRN **OR** ondansetron (ZOFRAN) injection 4 mg, 4 mg, Intravenous, Q6H PRN, Hessie Knows, MD .  thiamine (VITAMIN B-1) tablet 100 mg, 100 mg, Oral, Daily **OR** thiamine (B-1) injection 100 mg, 100 mg, Intravenous, Daily, Hessie Knows, MD .  traMADol Veatrice Bourbon) tablet 50 mg, 50 mg, Oral, Q6H, Hessie Knows, MD, 50 mg at 01/17/19 1511 .  zolpidem (AMBIEN) tablet 5 mg, 5 mg, Oral, QHS PRN, Hessie Knows, MD, 5 mg at 01/16/19 2138   Social History: Social History   Tobacco Use  . Smoking status: Never Smoker  . Smokeless tobacco: Never Used  Substance Use Topics  . Alcohol use: Yes  . Drug use: Never    Family Medical History: Family History  Problem Relation Age of Onset  . Alzheimer's disease Mother   . Suicidality Father   .  Suicidality Brother     Physical Examination: Vitals:   01/17/19 0738 01/17/19 1545  BP: 113/69 128/73  Pulse: 94 85  Resp: 18 18  Temp:  98.2 F (36.8 C)  SpO2: 100% 97%     General: Patient is lethargic. ENT:  Oral mucosa appears dry. Respiratory: Patient is breathing without any difficulty. Extremities: No edema. Skin:   On exposed skin, there are no abnormal skin lesions.  NEUROLOGICAL:  General: In no acute distress. Very lethargic  Awake, alert, oriented to person and place only. Left pupil pinpoint.  Subtle reactivity. (Right eye enucleated, verified on imaging)  Facial tone is symmetric.  Speech mumbled and difficult to understand at times Unable to assess tongue protrusion, pronator drift, sensation, EOMI, visual fields Upper extremities: Left grip 4+/5, 5/5 bicep, tricep. 5/5 right grip, bicep, tricep. Lower extremities: patient able to move lower extremities but patient was too lethargic at that time to participate.    Reflexes are 3+ and symmetric at the biceps, triceps, brachioradialis. Absent right patella reflex. 3+ left patella reflex.  Clonus is not present.    Imaging: EXAM: CT HEAD WITHOUT CONTRAST 01/17/2019  12:21   IMPRESSION: 1. Slight interval increase in size of a left subdural hematoma, which contains layering dependent blood product and now measures approximately 13 mm in maximum coronal thickness, previously 10 mm. There is no significant midline shift.  2.  Underlying small-vessel white matter disease. EXAM: CT HEAD WITHOUT CONTRAST 01/07/2019   19:10  IMPRESSION: No change since earlier today. Low-density subdural collection on the left measuring 9 mm in thickness. Small amount of hyperdense blood in the frontal region, not increased from earlier. Minimal mass effect with left-to-right shift of 2 mm, unchanged.  EXAM: CT HEAD WITHOUT CONTRAST 01/07/2019  13:07  IMPRESSION: 1. Chronic left-sided subdural hematoma/hygroma with  small focus of new/acute hemorrhage/hematoma in the left frontal area. 2. Mild likely chronic mass effect on the left sulci and left lateral ventricle. No subfalcine herniation. 3. No findings for hemispheric infarction or mass lesion. 4. No acute skull fracture.  Assessment and Plan: Mr. Riordan is a pleasant 78 y.o. male with altered mental status.  CT imaging from today significant for noted increase in left subdural hematoma in comparison to head CT approximately 10 days ago.  Patient seems to have gone from irritated and combative when initially evaluated in the emergency department to now very lethargic, however; he does respond to voice.  Unable to adequately determine baseline mental status.  Discussed case with Dr. Izora Ribas and he felt it was appropriate that patient be transferred for further evaluation and monitoring given altered mental status and CT findings.  Marin Olp, PA-C Dept. of Neurosurgery

## 2019-01-17 NOTE — Progress Notes (Signed)
PT Cancellation Note  Patient Details Name: Joshua Lynn MRN: [id number] DOB: 1940/09/16   Cancelled Treatment:    Reason Eval/Treat Not Completed: Medical issues which prohibited therapy(Patient remains generally lethargic with limited ability to participate. Of note, patient with head CT since AM, significant for increased size of SDH (increase by 22mm); scheduled for repeat imaging next date to evaluate stability.  Will continue hold at this time and re-evaluation appropriateness and ability to participate with skilled evaluation next date.)   Linsay Vogt H. Owens Shark, PT, DPT, NCS 01/17/19, 3:37 PM [phone]

## 2019-01-17 NOTE — Progress Notes (Signed)
OT Cancellation Note  Patient Details Name: Joshua Lynn MRN: [id number] DOB: 1941-07-01   Cancelled Evaluation:    Reason Eval/Treat Not Completed: Patient's level of consciousness. Order received and chart reviewed. Upon arrival to pt room, pt asleep in bed. Did not rouse to verbal or tactile cueing. Did not rouse to sternal rub. Will re-attempt at later date/time as available and pt medically appropriate for OT eval.   Shara Blazing, M.S., OTR/L Ascom: [phone] 01/17/19, 8:54 AM

## 2019-01-17 NOTE — Progress Notes (Signed)
Patient slept through shift. Woke later in the day and was able to take a few of his meds.

## 2019-01-17 NOTE — Progress Notes (Signed)
   Subjective: 1 Day Post-Op Procedure(s) (LRB): ANTERIOR APPROACH HEMI HIP ARTHROPLASTY (Right) Patient sleeping. Unable to awaken. Responds to touch. We will continue therapy today.    Objective: Vital signs in last 24 hours: Temp:  [96.6 F (35.9 C)-99.5 F (37.5 C)] 96.6 F (35.9 C) (06/19 0342) Pulse Rate:  [71-102] 94 (06/19 0738) Resp:  [12-20] 18 (06/19 0738) BP: (104-159)/(58-105) 113/69 (06/19 0738) SpO2:  [93 %-100 %] 100 % (06/19 0738)  Intake/Output from previous day: 06/18 0701 - 06/19 0700 In: 4350.8 [I.V.:3950.8; IV Piggyback:400] Out: 650 [Urine:450; Blood:200] Intake/Output this shift: No intake/output data recorded.  Recent Labs    01/14/19 2021 01/15/19 0647 01/16/19 1711 01/17/19 0640  HGB 12.5* 12.1* 11.4* 9.8*   Recent Labs    01/16/19 1711 01/17/19 0640  WBC 16.9* 11.1*  RBC 3.58* 3.14*  HCT 33.2* 28.5*  PLT 230 197   Recent Labs    01/15/19 0647 01/16/19 1711 01/17/19 0640  NA 129*  --  131*  K 3.7  --  3.5  CL 97*  --  103  CO2 19*  --  21*  BUN 12  --  19  CREATININE 0.93 1.00 0.92  GLUCOSE 112*  --  110*  CALCIUM 8.9  --  7.9*   Recent Labs    01/14/19 2021  INR 1.0    EXAM General - Patient is sleeping Extremity - Incision: dressing C/D/I and scant drainage. Thigh soft. Calf soft.  Dressing - dressing C/D/I and scant drainage   Past Medical History:  Diagnosis Date  . Alcohol use   . Anxiety   . GERD (gastroesophageal reflux disease)   . Hypertension     Assessment/Plan:   1 Day Post-Op Procedure(s) (LRB): ANTERIOR APPROACH HEMI HIP ARTHROPLASTY (Right) Principal Problem:   Closed right hip fracture (HCC) Active Problems:   Alcohol abuse   HTN (hypertension)   Anxiety  Estimated body mass index is 24.62 kg/m as calculated from the following:   Height as of this encounter: 5\' 5"  (1.651 m).   Weight as of this encounter: 67.1 kg. Advance diet Up with therapy  Needs BM Acute post op blood loss  anemia - Hgb 9.8.  Recheck labs in the am VSS CM to assist with discharge   DVT Prophylaxis - Lovenox, Foot Pumps and TED hose Weight-Bearing as tolerated to right leg   T. Rachelle Hora, PA-C [place] 01/17/2019, 8:24 AM

## 2019-01-17 NOTE — Anesthesia Postprocedure Evaluation (Signed)
Anesthesia Post Note  Patient: Joshua Lynn  Procedure(s) Performed: ANTERIOR APPROACH HEMI HIP ARTHROPLASTY (Right Hip)  Patient location during evaluation: Nursing Unit Anesthesia Type: Spinal Level of consciousness: awake, oriented and awake and alert Pain management: pain level controlled Vital Signs Assessment: post-procedure vital signs reviewed and stable Respiratory status: spontaneous breathing Cardiovascular status: blood pressure returned to baseline Postop Assessment: no headache Anesthetic complications: no     Last Vitals:  Vitals:   01/17/19 0500 01/17/19 0738  BP: 117/79 113/69  Pulse: 83 94  Resp:  18  Temp:    SpO2:  100%    Last Pain:  Vitals:   01/17/19 0342  TempSrc: Axillary  PainSc:                  [place]

## 2019-01-17 NOTE — Progress Notes (Addendum)
Patient with history of alcohol abuse, was in the emergency room 10 days ago and had a CT head after a fall.  CT head from 01/07/2019 showing left-sided subdural hematoma of around 9 mm in thickness. Patient came in after repeat fall and now has a right hip fracture. -More somnolent today, could be from Ativan that he received for his withdrawals.  However given his repeat fall prior to this admission-repeat CT has been done today on 01/17/2019 showing increasing his subdural hematoma to 13 mm.  However no midline shift has been noted. Discussed with neurosurgeon Dr. Volney Presser has reviewed the films and discussed with me.  No midline shift noted, so hold the Lovenox.  Repeat CT head tomorrow and monitor. - It should not have caused his AMS. Patient has a lot of brain atrophy noted.   Addendum at 4:45PM- discussed with Neurosurgeon and they have recommended transfer to [facility] neuro surgery service for close monitoring and intervention if needed.  Dr. Lonny Prude, neurosurgeon at [facility] has accepted the patient for transfer. Ortho has been updated as well.

## 2019-01-17 NOTE — Care Management Important Message (Signed)
Important Message  Patient Details  Name: Joshua Lynn MRN: [id number] Date of Birth: [birth year]   Medicare Important Message Given:  Yes  Initial Medicare IM given by Patient Access Associate on 01/16/2019 at 1108.  Still valid.   Dannette Barbara 01/17/2019, 9:01 AM

## 2019-01-18 ENCOUNTER — Inpatient Hospital Stay: Payer: Medicare Other

## 2019-01-18 LAB — CBC
HCT: 27.8 % — ABNORMAL LOW (ref 39.0–52.0)
Hemoglobin: 9.5 g/dL — ABNORMAL LOW (ref 13.0–17.0)
MCH: 31 pg (ref 26.0–34.0)
MCHC: 34.2 g/dL (ref 30.0–36.0)
MCV: 90.8 fL (ref 80.0–100.0)
Platelets: 218 10*3/uL (ref 150–400)
RBC: 3.06 MIL/uL — ABNORMAL LOW (ref 4.22–5.81)
RDW: 13.2 % (ref 11.5–15.5)
WBC: 9.3 10*3/uL (ref 4.0–10.5)
nRBC: 0 % (ref 0.0–0.2)

## 2019-01-18 LAB — BASIC METABOLIC PANEL
Anion gap: 8 (ref 5–15)
BUN: 18 mg/dL (ref 8–23)
CO2: 21 mmol/L — ABNORMAL LOW (ref 22–32)
Calcium: 8.2 mg/dL — ABNORMAL LOW (ref 8.9–10.3)
Chloride: 107 mmol/L (ref 98–111)
Creatinine, Ser: 0.98 mg/dL (ref 0.61–1.24)
GFR calc Af Amer: >60 mL/min (ref >60)
GFR calc non Af Amer: >60 mL/min (ref >60)
Glucose, Bld: 92 mg/dL (ref 70–99)
Potassium: 3.6 mmol/L (ref 3.5–5.1)
Sodium: 136 mmol/L (ref 135–145)

## 2019-01-18 NOTE — Progress Notes (Signed)
Updated Hendricks Limes who is patient's son over the phone today

## 2019-01-18 NOTE — Progress Notes (Signed)
Nurse Augustin Coupe called from [facility] to follow up on patient and check health status. Beds are still unavailable for his level of care at [facility] but she will call with updates.

## 2019-01-18 NOTE — Progress Notes (Signed)
   Subjective: 2 Days Post-Op Procedure(s) (LRB): ANTERIOR APPROACH HEMI HIP ARTHROPLASTY (Right) Patient sleeping. Unable to awaken. Responds to touch. We will continue therapy today.    Objective: Vital signs in last 24 hours: Temp:  [98.1 F (36.7 C)-98.8 F (37.1 C)] 98.7 F (37.1 C) (06/20 0522) Pulse Rate:  [85-99] 88 (06/20 0522) Resp:  [16-18] 18 (06/20 0522) BP: (110-139)/(58-90) 139/90 (06/20 0522) SpO2:  [94 %-99 %] 96 % (06/20 0522)  Intake/Output from previous day: 06/19 0701 - 06/20 0700 In: 2297.7 [I.V.:2297.7] Out: -  Intake/Output this shift: No intake/output data recorded.  Recent Labs    01/16/19 1711 01/17/19 0640 01/18/19 0510  HGB 11.4* 9.8* 9.5*   Recent Labs    01/17/19 0640 01/18/19 0510  WBC 11.1* 9.3  RBC 3.14* 3.06*  HCT 28.5* 27.8*  PLT 197 218   Recent Labs    01/17/19 0640 01/18/19 0510  NA 131* 136  K 3.5 3.6  CL 103 107  CO2 21* 21*  BUN 19 18  CREATININE 0.92 0.98  GLUCOSE 110* 92  CALCIUM 7.9* 8.2*   No results for input(s): LABPT, INR in the last 72 hours.  EXAM General - Patient is sleeping Extremity - Incision: dressing C/D/I and scant drainage. Thigh soft. Calf soft.  Scant bloody drainage. Dressing - dressing C/D/I and scant drainage   Past Medical History:  Diagnosis Date  . Alcohol use   . Anxiety   . GERD (gastroesophageal reflux disease)   . Hypertension     Assessment/Plan:   2 Days Post-Op Procedure(s) (LRB): ANTERIOR APPROACH HEMI HIP ARTHROPLASTY (Right) Principal Problem:   Closed right hip fracture (HCC) Active Problems:   Alcohol abuse   HTN (hypertension)   Anxiety  Estimated body mass index is 24.62 kg/m as calculated from the following:   Height as of this encounter: 5\' 5"  (1.651 m).   Weight as of this encounter: 67.1 kg. Advance diet Up with therapy  Needs BM Acute post op blood loss anemia - Hgb 9.8.  Recheck labs in the am VSS CM to assist with transfer to [facility].   Subdural hematoma has increased and is being evaluated by neurosurgery.  DVT Prophylaxis - Lovenox, Foot Pumps and TED hose Weight-Bearing as tolerated to right leg   T. Rachelle Hora, PA-C [place] 01/18/2019, 7:41 AM

## 2019-01-18 NOTE — Progress Notes (Signed)
[place] [facility] [place] NAME: Joshua Lynn    MR#:  [id number]  DATE OF BIRTH:  1941-04-10  SUBJECTIVE:  CHIEF COMPLAINT:  No chief complaint on file.  -Fall and right femoral neck fracture.  Status post surgery and postop day 2 today. -More alert than yesterday.  However confused and occasional visual hallucinations noted.  REVIEW OF SYSTEMS:  Review of Systems  Unable to perform ROS: Mental status change    DRUG ALLERGIES:  No Known Allergies  VITALS:  Blood pressure (!) 151/94, pulse 84, temperature 99.2 F (37.3 C), temperature source Oral, resp. rate 16, height 5\' 5"  (1.651 m), weight 67.1 kg, SpO2 97 %.  PHYSICAL EXAMINATION:  Physical Exam   GENERAL:  78 y.o.-year-old disheveled appearing patient lying in the bed, sleeping soundly EYES: Right eye is enucleated, left pupil equal, round, reactive to light and accommodation. No scleral icterus. Extraocular muscles intact.  HEENT: Head atraumatic, normocephalic. Oropharynx and nasopharynx clear.  NECK:  Supple, no jugular venous distention. No thyroid enlargement, no tenderness.  LUNGS: Normal breath sounds bilaterally, no wheezing, rales,rhonchi or crepitation. No use of accessory muscles of respiration.  Decreased bibasilar breath sounds CARDIOVASCULAR: S1, S2 normal. No murmurs, rubs, or gallops.  ABDOMEN: Soft, nontender, nondistended. Bowel sounds present. No organomegaly or mass.  EXTREMITIES: No pedal edema, cyanosis, or clubbing.  No hand tremors -Dressing in place to the right lateral hip NEUROLOGIC: Cranial nerves II through XII are intact. Muscle strength 5/5 in all extremities except right lower extremity, limited motion due to pain. Sensation intact. Gait not checked.  PSYCHIATRIC: The patient is alert and disoriented.  Some visual hallucinations noted. SKIN: No obvious rash, lesion, or ulcer.    LABORATORY PANEL:   CBC Recent Labs  Lab 01/18/19 0510  WBC 9.3   HGB 9.5*  HCT 27.8*  PLT 218   ------------------------------------------------------------------------------------------------------------------  Chemistries  Recent Labs  Lab 01/18/19 0510  NA 136  K 3.6  CL 107  CO2 21*  GLUCOSE 92  BUN 18  CREATININE 0.98  CALCIUM 8.2*   ------------------------------------------------------------------------------------------------------------------  Cardiac Enzymes No results for input(s): TROPONINI in the last 168 hours. ------------------------------------------------------------------------------------------------------------------  RADIOLOGY:  Ct Head Wo Contrast  Result Date: 01/17/2019 CLINICAL DATA:  Re-evaluate subdural hemorrhage EXAM: CT HEAD WITHOUT CONTRAST TECHNIQUE: Contiguous axial images were obtained from the base of the skull through the vertex without intravenous contrast. COMPARISON:  01/07/2019 FINDINGS: Brain: Slight interval increase in size of a left subdural hematoma, which contains layering dependent blood product and now measures approximately 13 mm in maximum coronal thickness, previously 10 mm. There is no significant midline shift. Extensive periventricular white matter hypodensity and global volume loss. Vascular: No hyperdense vessel or unexpected calcification. Skull: Normal. Negative for fracture or focal lesion. Sinuses/Orbits: No acute finding.  Right ocular prosthesis. Other: None. IMPRESSION: 1. Slight interval increase in size of a left subdural hematoma, which contains layering dependent blood product and now measures approximately 13 mm in maximum coronal thickness, previously 10 mm. There is no significant midline shift. 2.  Underlying small-vessel white matter disease. These results will be called to the ordering clinician or representative by the Radiologist Assistant, and communication documented in the PACS or zVision Dashboard. Electronically Signed   By: Eddie Candle M.D.   On: 01/17/2019 12:21   Dg  Hip Operative Unilat W Or W/o Pelvis Right  Result Date: 01/16/2019 CLINICAL DATA:  Hip arthroplasty EXAM: OPERATIVE right HIP (WITH PELVIS  IF PERFORMED) 1 VIEWS TECHNIQUE: Fluoroscopic spot image(s) were submitted for interpretation post-operatively. COMPARISON:  01/14/2019 FINDINGS: Single low resolution intraoperative spot view of the right hip. Total fluoroscopy time was 12 seconds. The image demonstrates a right hip replacement with normal alignment IMPRESSION: Intraoperative fluoroscopic assistance provided during right hip replacement. Electronically Signed   By: Donavan Foil M.D.   On: 01/16/2019 15:45   Dg Hip Unilat W Or W/o Pelvis 2-3 Views Right  Result Date: 01/16/2019 CLINICAL DATA:  Fracture of the proximal right femur. EXAM: DG HIP (WITH OR WITHOUT PELVIS) 2-3V RIGHT COMPARISON:  01/14/2019 FINDINGS: Right hip arthroplasty is located. The entire femoral stem is visualized. Lucency at the right greater trochanter is likely representing postoperative change. Surgical skin staples are present. IMPRESSION: Expected changes from a right hip arthroplasty. Electronically Signed   By: Markus Daft M.D.   On: 01/16/2019 16:37    EKG:   Orders placed or performed during the hospital encounter of 01/14/19  . ED EKG  . ED EKG    ASSESSMENT AND PLAN:   78 year old male with past medical history significant for hypertension, GERD, anxiety, alcohol abuse presents to hospital secondary to a fall that he does not remember and noted to have right femoral neck fracture.  1. Right femoral neck fracture-admitted, orthopedics consult appreciated. -Status post surgery and postop day 2 today -Continue pain control. -Physical therapy twill be on hold now until cleared by Neurosurgery  2. Hyponatremia-improved withIV fluids  3.. Alcohol abuse-monitor closely on CIWA protocol.  4. Altered mental status-very somnolent on 01/17/2019 - initially thought to be from  Ativan received  for  possible withdrawals. - CT head was ordered on 01/17/2019 showing increase in sub dural hematoma on left convexity- of 79mm. There is no midline shift. Patient had a CT head on 01/07/19 in ED after a fall and it showed 29mm subdural hematoma. -Due to increase in the size of subdural hematoma and altered mental status- neuro surgery was consulted. Dr. Meade Maw has kindly reviewed the films and recommended transfer to [facility] for closer observation and intervention if needed.  4. DVT prophylaxis-Lovenox on hold due to sub dural hematoma  5. Fever of unknown origin- had a one time fever of 102F on 01/16/2019, and empirically  started on Rocephin- stop after 3 days. . bloodCultures remain negative.chest x-ray on admission was negative as well. -could be neurogenic given hematoma?  No further fevers noted   Patient will be transferred to [facility] when bed available   All the records are reviewed and case discussed with Care Management/Social Workerr. Management plans discussed with the patient, family and they are in agreement.  CODE STATUS: Full code  TOTAL TIME TAKING CARE OF THIS PATIENT: 39 minutes.   POSSIBLE D/C IN 2-3 DAYS, DEPENDING ON CLINICAL CONDITION.   Gladstone Lighter M.D on 01/18/2019 at 11:41 AM  Between 7am to 6pm - Pager - [phone]  After 6pm go to [email or web address] - password [email or web address] [place] [facility]  Office  [phone]  CC: Primary care physician; Administration, [employer]

## 2019-01-18 NOTE — Progress Notes (Signed)
PT Cancellation Note  Patient Details Name: Joshua Lynn MRN: [id number] DOB: Oct 14, 1940   Cancelled Treatment:    Reason Eval/Treat Not Completed: Medical issues which prohibited therapy.  Per chart review, plan for pt to transfer to [facility] d/t neuro concerns.  Also per Dr. Wyatt Portela note today, PT will be on hold until cleared by neurosurgery.  Will monitor pt's status and initiate PT intervention if appropriate.  Leitha Bleak, PT 01/18/19, 6:28 PM [phone]

## 2019-01-18 NOTE — Progress Notes (Signed)
OT Cancellation Note  Patient Details Name: Joshua Lynn MRN: [id number] DOB: 04/06/1941   Cancelled Treatment:     Per chart review, pt being t/f to [facility] for increasing neuro concerns. Will hold OT eval at this time and defer to next venue of care.  Zenovia Jarred, MSOT, OTR/L Behavioral Health OT/ Acute Relief OT [facility] [phone]   Zenovia Jarred 01/18/2019, 8:45 AM

## 2019-01-19 ENCOUNTER — Ambulatory Visit (HOSPITAL_COMMUNITY)
Admission: AD | Admit: 2019-01-19 | Discharge: 2019-01-19 | Disposition: A | Discharge status: Home / Self Care | Payer: Medicare Other | Source: Other Acute Inpatient Hospital | Attending: Internal Medicine | Admitting: Internal Medicine

## 2019-01-19 DIAGNOSIS — F419 Anxiety disorder, unspecified: Secondary | ICD-10-CM | POA: Diagnosis present

## 2019-01-19 DIAGNOSIS — M25561 Pain in right knee: Secondary | ICD-10-CM | POA: Diagnosis not present

## 2019-01-19 DIAGNOSIS — R402363 Coma scale, best motor response, obeys commands, at hospital admission: Secondary | ICD-10-CM | POA: Diagnosis present

## 2019-01-19 DIAGNOSIS — Z1159 Encounter for screening for other viral diseases: Secondary | ICD-10-CM | POA: Diagnosis not present

## 2019-01-19 DIAGNOSIS — S060X9A Concussion with loss of consciousness of unspecified duration, initial encounter: Secondary | ICD-10-CM | POA: Diagnosis not present

## 2019-01-19 DIAGNOSIS — K219 Gastro-esophageal reflux disease without esophagitis: Secondary | ICD-10-CM | POA: Diagnosis present

## 2019-01-19 DIAGNOSIS — Z7401 Bed confinement status: Secondary | ICD-10-CM | POA: Diagnosis not present

## 2019-01-19 DIAGNOSIS — R402243 Coma scale, best verbal response, confused conversation, at hospital admission: Secondary | ICD-10-CM | POA: Diagnosis present

## 2019-01-19 DIAGNOSIS — E876 Hypokalemia: Secondary | ICD-10-CM | POA: Diagnosis present

## 2019-01-19 DIAGNOSIS — Z970 Presence of artificial eye: Secondary | ICD-10-CM | POA: Diagnosis not present

## 2019-01-19 DIAGNOSIS — E871 Hypo-osmolality and hyponatremia: Secondary | ICD-10-CM | POA: Diagnosis present

## 2019-01-19 DIAGNOSIS — R402143 Coma scale, eyes open, spontaneous, at hospital admission: Secondary | ICD-10-CM | POA: Diagnosis present

## 2019-01-19 DIAGNOSIS — S72031A Displaced midcervical fracture of right femur, initial encounter for closed fracture: Secondary | ICD-10-CM | POA: Diagnosis not present

## 2019-01-19 DIAGNOSIS — M6281 Muscle weakness (generalized): Secondary | ICD-10-CM | POA: Diagnosis not present

## 2019-01-19 DIAGNOSIS — Z96641 Presence of right artificial hip joint: Secondary | ICD-10-CM | POA: Diagnosis present

## 2019-01-19 DIAGNOSIS — E639 Nutritional deficiency, unspecified: Secondary | ICD-10-CM | POA: Diagnosis present

## 2019-01-19 DIAGNOSIS — N400 Enlarged prostate without lower urinary tract symptoms: Secondary | ICD-10-CM | POA: Diagnosis present

## 2019-01-19 DIAGNOSIS — S065X9A Traumatic subdural hemorrhage with loss of consciousness of unspecified duration, initial encounter: Secondary | ICD-10-CM | POA: Diagnosis present

## 2019-01-19 DIAGNOSIS — R410 Disorientation, unspecified: Secondary | ICD-10-CM | POA: Diagnosis not present

## 2019-01-19 DIAGNOSIS — I6200 Nontraumatic subdural hemorrhage, unspecified: Secondary | ICD-10-CM | POA: Diagnosis not present

## 2019-01-19 DIAGNOSIS — Z9181 History of falling: Secondary | ICD-10-CM | POA: Diagnosis not present

## 2019-01-19 MED ORDER — LORAZEPAM 2 MG/ML IJ SOLN
1.0000 mg | Freq: Four times a day (QID) | INTRAMUSCULAR | Status: DC | PRN
  Administered 2019-01-19: 14:00:00 2 mg via INTRAVENOUS
  Filled 2019-01-19: qty 1

## 2019-01-19 NOTE — Discharge Summary (Signed)
[facility] NAME: Joshua Lynn    MR#:  [id number]  DATE OF BIRTH:  1941/04/04  DATE OF ADMISSION:  01/14/2019   ADMITTING PHYSICIAN: Lance Coon, MD  DATE OF DISCHARGE: 01/19/2019  PRIMARY CARE PHYSICIAN: Administration, Veterans   ADMISSION DIAGNOSIS:   Fall [W19.XXXA] Closed fracture of right hip, initial encounter ([place]) [S72.001A]  DISCHARGE DIAGNOSIS:   Principal Problem:   Closed right hip fracture ([place]) Active Problems:   Alcohol abuse   HTN (hypertension)   Anxiety   SECONDARY DIAGNOSIS:   Past Medical History:  Diagnosis Date   Alcohol use    Anxiety    GERD (gastroesophageal reflux disease)    Hypertension     HOSPITAL COURSE:   78 year old male with past medical history significant for hypertension, GERD, anxiety, alcohol abuse presents to hospital secondary to a fall that he does not remember and noted to have right femoral neck fracture.  1. Right femoral neck fracture-admitted, orthopedics consult appreciated. -Status post surgery and postop day 3 today -Continue pain control. -Physical therapy will be on hold now until cleared by Neurosurgery  2. Hyponatremia-improved withIV fluids  3.. Alcohol abuse-monitor closely on CIWA protocol.  4. Altered mental status-very somnolent on 01/17/2019- initially thought to be fromAtivan received for possiblewithdrawals. - CT head was ordered on 01/17/2019 showing increase in sub dural hematoma on left convexity- of 39mm compared to one on 01/07/19. There is no midline shift. Patient had a CT head on 01/07/19 in ED after a fall and it showed 46mm subdural hematoma. -Due to increase in the size of subdural hematoma and altered mental status- neuro surgery was consulted. Dr. Meade Maw has kindly reviewed the films and recommended transfer to [facility] for closer observation and intervention if needed. While awaiting for bed at [facility], patient  had a follow up CT head on 01/18/19 showing Acute and chronic subdural hematoma on the left. Small amount of new blood in the left subdural space compared with the recent study. Mild midline shift to the right unchanged. Atrophy and chronic ischemic changes throughout the white matter.  4. DVT prophylaxis-Lovenox on hold due to sub dural hematoma  5. Fever of unknown origin-RESOLVED NOW> - had a one time fever of 102F on 01/16/2019, and was empirically started on Rocephin- stopped now. .bloodCultures remain negative.chestx-ray on admission was negative as well. -could be neurogenic given hematoma?  No further fevers noted   Patient will be transferred to [facility] when bed available  DISCHARGE CONDITIONS:   Critical  CONSULTS OBTAINED:   Treatment Team:  Corky Mull, MD Hessie Knows, MD  Neuro surgery by Dr. Meade Maw  DRUG ALLERGIES:   No Known Allergies DISCHARGE MEDICATIONS:   Allergies as of 01/19/2019   No Known Allergies     Medication List    STOP taking these medications   predniSONE 20 MG tablet Commonly known as: Deltasone   tiZANidine 2 MG tablet Commonly known as: ZANAFLEX     TAKE these medications   alum & mag hydroxide-simeth 200-200-20 MG/5ML suspension Commonly known as: MAALOX/MYLANTA Take 30 mLs by mouth every 4 (four) hours as needed for indigestion.   cefTRIAXone 1 g in sodium chloride 0.9 % 100 mL Inject 1 g into the vein daily.   doxazosin 4 MG tablet Commonly known as: CARDURA Take 4 mg by mouth at bedtime.   HYDROcodone-acetaminophen 5-325 MG tablet Commonly known as: NORCO/VICODIN Take 1-2 tablets by mouth every 4 (  four) hours as needed for moderate pain (pain score 4-6).   magnesium hydroxide 400 MG/5ML suspension Commonly known as: MILK OF MAGNESIA Take 30 mLs by mouth daily as needed for mild constipation.   methocarbamol 500 MG tablet Commonly known as: ROBAXIN Take 1 tablet (500 mg total) by mouth every 6  (six) hours as needed for muscle spasms.   ondansetron 4 MG/2ML Soln injection Commonly known as: ZOFRAN Inject 2 mLs (4 mg total) into the vein every 6 (six) hours as needed for nausea.            Durable Medical Equipment  (From admission, onward)         Start     Ordered   01/16/19 1658  DME Walker rolling  Once    Question:  Patient needs a walker to treat with the following condition  Answer:  Status post hip hemiarthroplasty   01/16/19 1658   01/16/19 1658  DME 3 n 1  Once     01/16/19 1658   01/16/19 1658  DME Bedside commode  Once    Question:  Patient needs a bedside commode to treat with the following condition  Answer:  Status post hip hemiarthroplasty   01/16/19 1658           DISCHARGE INSTRUCTIONS:   Patient being transferred to [facility] neurosurgery service  DIET:   Cardiac diet  ACTIVITY:   Bedrest until evaluated by neuro surgery  OXYGEN:   Home Oxygen: No Oxygen Delivery: Room air  DISCHARGE LOCATION:   [facility]  If you experience worsening of your admission symptoms, develop shortness of breath, life threatening emergency, suicidal or homicidal thoughts you must seek medical attention immediately by calling 911 or calling your MD immediately  if symptoms less severe.  You Must read complete instructions/literature along with all the possible adverse reactions/side effects for all the Medicines you take and that have been prescribed to you. Take any new Medicines after you have completely understood and accpet all the possible adverse reactions/side effects.   Please note  You were cared for by a hospitalist during your hospital stay. If you have any questions about your discharge medications or the care you received while you were in the hospital after you are discharged, you can call the unit and asked to speak with the hospitalist on call if the hospitalist that took care of you is not available. Once you are discharged, your  primary care physician will handle any further medical issues. Please note that NO REFILLS for any discharge medications will be authorized once you are discharged, as it is imperative that you return to your primary care physician (or establish a relationship with a primary care physician if you do not have one) for your aftercare needs so that they can reassess your need for medications and monitor your lab values.    On the day of Discharge:  VITAL SIGNS:   Blood pressure (!) 157/94, pulse 92, temperature 97.8 F (36.6 C), resp. rate 19, height 5\' 5"  (1.651 m), weight 67.1 kg, SpO2 98 %.  PHYSICAL EXAMINATION:   GENERAL:  78 y.o.-year-old disheveled appearing patient lying in the bed, sleeping soundly EYES: Right eye is enucleated, left pupil equal, round, reactive to light and accommodation. No scleral icterus. Extraocular muscles intact.  HEENT: Head atraumatic, normocephalic. Oropharynx and nasopharynx clear.  NECK:  Supple, no jugular venous distention. No thyroid enlargement, no tenderness.  LUNGS: Normal breath sounds bilaterally, no wheezing, rales,rhonchi or crepitation.  No use of accessory muscles of respiration.  Decreased bibasilar breath sounds CARDIOVASCULAR: S1, S2 normal. No murmurs, rubs, or gallops.  ABDOMEN: Soft, nontender, nondistended. Bowel sounds present. No organomegaly or mass.  EXTREMITIES: No pedal edema, cyanosis, or clubbing.  No hand tremors -Dressing in place to the right lateral hip NEUROLOGIC: Cranial nerves II through XII are intact. Muscle strength 5/5 in all extremities except right lower extremity, limited motion due to pain. Sensation intact. Gait not checked.  PSYCHIATRIC: The patient is alert and disoriented.  Some visual hallucinations noted. SKIN: No obvious rash, lesion, or ulcer.     DATA REVIEW:   CBC Recent Labs  Lab 01/18/19 0510  WBC 9.3  HGB 9.5*  HCT 27.8*  PLT 218    Chemistries  Recent Labs  Lab 01/18/19 0510  NA 136    K 3.6  CL 107  CO2 21*  GLUCOSE 92  BUN 18  CREATININE 0.98  CALCIUM 8.2*     Microbiology Results  Results for orders placed or performed during the hospital encounter of 01/14/19  SARS Coronavirus 2 (CEPHEID- Performed in [place] hospital lab), Hosp Order     Status: None   Collection Time: 01/14/19  8:21 PM   Specimen: Nasopharyngeal Swab  Result Value Ref Range Status   SARS Coronavirus 2 NEGATIVE NEGATIVE Final    Comment: (NOTE) If result is NEGATIVE SARS-CoV-2 target nucleic acids are NOT DETECTED. The SARS-CoV-2 RNA is generally detectable in upper and lower  respiratory specimens during the acute phase of infection. The lowest  concentration of SARS-CoV-2 viral copies this assay can detect is 250  copies / mL. A negative result does not preclude SARS-CoV-2 infection  and should not be used as the sole basis for treatment or other  patient management decisions.  A negative result may occur with  improper specimen collection / handling, submission of specimen other  than nasopharyngeal swab, presence of viral mutation(s) within the  areas targeted by this assay, and inadequate number of viral copies  (<250 copies / mL). A negative result must be combined with clinical  observations, patient history, and epidemiological information. If result is POSITIVE SARS-CoV-2 target nucleic acids are DETECTED. The SARS-CoV-2 RNA is generally detectable in upper and lower  respiratory specimens dur ing the acute phase of infection.  Positive  results are indicative of active infection with SARS-CoV-2.  Clinical  correlation with patient history and other diagnostic information is  necessary to determine patient infection status.  Positive results do  not rule out bacterial infection or co-infection with other viruses. If result is PRESUMPTIVE POSTIVE SARS-CoV-2 nucleic acids MAY BE PRESENT.   A presumptive positive result was obtained on the submitted specimen  and  confirmed on repeat testing.  While 2019 novel coronavirus  (SARS-CoV-2) nucleic acids may be present in the submitted sample  additional confirmatory testing may be necessary for epidemiological  and / or clinical management purposes  to differentiate between  SARS-CoV-2 and other Sarbecovirus currently known to infect humans.  If clinically indicated additional testing with an alternate test  methodology [phone]) is advised. The SARS-CoV-2 RNA is generally  detectable in upper and lower respiratory sp ecimens during the acute  phase of infection. The expected result is Negative. Fact Sheet for Patients:  StrictlyIdeas.no Fact Sheet for Healthcare Providers: BankingDealers.co.za This test is not yet approved or cleared by the [place] FDA and has been authorized for detection and/or diagnosis of SARS-CoV-2 by FDA under an Emergency  Use Authorization (EUA).  This EUA will remain in effect (meaning this test can be used) for the duration of the COVID-19 declaration under Section 564(b)(1) of the Act, 21 U.S.C. section 360bbb-3(b)(1), unless the authorization is terminated or revoked sooner. Performed at [facility], [address] [place], [place] [address]   MRSA PCR Screening     Status: None   Collection Time: 01/14/19 11:58 PM   Specimen: Nasal Mucosa; Nasopharyngeal  Result Value Ref Range Status   MRSA by PCR NEGATIVE NEGATIVE Final    Comment:        The GeneXpert MRSA Assay (FDA approved for NASAL specimens only), is one component of a comprehensive MRSA colonization surveillance program. It is not intended to diagnose MRSA infection nor to guide or monitor treatment for MRSA infections. Performed at [facility], [address] [place], [place] [address]   Culture, blood (Routine X 2) w Reflex to ID Panel     Status: None (Preliminary result)   Collection Time: 01/16/19 12:36 AM   Specimen:  BLOOD  Result Value Ref Range Status   Specimen Description BLOOD BLOOD LEFT FOREARM  Final   Special Requests   Final    BOTTLES DRAWN AEROBIC AND ANAEROBIC Blood Culture adequate volume   Culture   Final    NO GROWTH 3 DAYS Performed at [facility], [address] [place], [place] [address]    Report Status PENDING  Incomplete  Culture, blood (Routine X 2) w Reflex to ID Panel     Status: None (Preliminary result)   Collection Time: 01/16/19 12:41 AM   Specimen: BLOOD  Result Value Ref Range Status   Specimen Description BLOOD BLOOD LEFT HAND  Final   Special Requests   Final    BOTTLES DRAWN AEROBIC AND ANAEROBIC Blood Culture adequate volume   Culture   Final    NO GROWTH 3 DAYS Performed at [facility], [address] [place], [place] [address]    Report Status PENDING  Incomplete    RADIOLOGY:  Ct Head Wo Contrast  Result Date: 01/18/2019 CLINICAL DATA:  Follow-up subdural hemorrhage EXAM: CT HEAD WITHOUT CONTRAST TECHNIQUE: Contiguous axial images were obtained from the base of the skull through the vertex without intravenous contrast. COMPARISON:  CT 01/17/2019 FINDINGS: Brain: Chronic subdural hematoma on the left measuring approximately 12 mm. Layering intermediate density hemorrhage posteriorly is unchanged. There is new hemorrhage in the subdural space on the left with small areas of hyperdense blood which were not present previously. Mass-effect on the left hemisphere with 3 mm midline shift to the right. No significant change. Moderate atrophy. Chronic ischemic changes in the white matter. No acute infarct or mass. Vascular: Negative for hyperdense vessel Skull: Negative Sinuses/Orbits: Mucosal edema paranasal sinuses. Right eye prosthesis. Negative left orbit. Other: None IMPRESSION: Acute and chronic subdural hematoma on the left. Small amount of new blood in the left subdural space compared with the recent study. Mild midline shift to the right  unchanged. Atrophy and chronic ischemic changes throughout the white matter. These results will be called to the ordering clinician or representative by the Radiologist Assistant, and communication documented in the PACS or zVision Dashboard. Electronically Signed   By: Franchot Gallo M.D.   On: 01/18/2019 14:29     Management plans discussed with the patient, family and they are in agreement.  CODE STATUS:     Code Status Orders  (From admission, onward)  Start     Ordered   01/14/19 2241  Full code  Continuous     01/14/19 2240        Code Status History    Date Active Date Inactive Code Status Order ID Comments User Context   01/07/2019 1517 01/07/2019 2318 Full Code [id number]  Merlyn Lot, MD ED   Advance Care Planning Activity      TOTAL TIME TAKING CARE OF THIS PATIENT: 39 minutes.    Gladstone Lighter M.D on 01/19/2019 at 7:54 AM  Between 7am to 6pm - Pager - [phone]  After 6pm go to [email or web address] - Technical brewer [place] [facility]  Office  [phone]  CC: Primary care physician; Administration, Veterans   Note: This dictation was prepared with Diplomatic Services operational officer dictation along with smaller Company secretary. Any transcriptional errors that result from this process are unintentional.

## 2019-01-19 NOTE — Progress Notes (Signed)
[facility] is here to transport patient to [facility].

## 2019-01-19 NOTE — Progress Notes (Signed)
   Subjective: 3 Days Post-Op Procedure(s) (LRB): ANTERIOR APPROACH HEMI HIP ARTHROPLASTY (Right) Patient awake with confusion.  Communicating with mild hallucination We will continue therapy today.    Objective: Vital signs in last 24 hours: Temp:  [97.6 F (36.4 C)-99.2 F (37.3 C)] 97.8 F (36.6 C) (06/20 2344) Pulse Rate:  [84-92] 92 (06/20 2344) Resp:  [16-19] 19 (06/20 2344) BP: (151-170)/(88-94) 157/94 (06/20 2344) SpO2:  [97 %-99 %] 98 % (06/20 2344)  Intake/Output from previous day: 06/20 0701 - 06/21 0700 In: 120 [P.O.:120] Out: -  Intake/Output this shift: No intake/output data recorded.  Recent Labs    01/16/19 1711 01/17/19 0640 01/18/19 0510  HGB 11.4* 9.8* 9.5*   Recent Labs    01/17/19 0640 01/18/19 0510  WBC 11.1* 9.3  RBC 3.14* 3.06*  HCT 28.5* 27.8*  PLT 197 218   Recent Labs    01/17/19 0640 01/18/19 0510  NA 131* 136  K 3.5 3.6  CL 103 107  CO2 21* 21*  BUN 19 18  CREATININE 0.92 0.98  GLUCOSE 110* 92  CALCIUM 7.9* 8.2*   No results for input(s): LABPT, INR in the last 72 hours.  EXAM General - Patient is sleeping Extremity - Incision: dressing C/D/I and scant drainage. Thigh soft. Calf soft.  Scant bloody drainage. Dressing - dressing C/D/I and scant drainage   Past Medical History:  Diagnosis Date  . Alcohol use   . Anxiety   . GERD (gastroesophageal reflux disease)   . Hypertension     Assessment/Plan:   3 Days Post-Op Procedure(s) (LRB): ANTERIOR APPROACH HEMI HIP ARTHROPLASTY (Right) Principal Problem:   Closed right hip fracture ([facility]) Active Problems:   Alcohol abuse   HTN (hypertension)   Anxiety  Estimated body mass index is 24.62 kg/m as calculated from the following:   Height as of this encounter: 5\' 5"  (1.651 m).   Weight as of this encounter: 67.1 kg. Advance diet Up with therapy  Needs BM Acute post op blood loss anemia - Hgb 9.5.  VSS CM to assist with transfer to [facility].  Subdural hematoma has  increased and is being evaluated by neurosurgery.  DVT Prophylaxis - Lovenox, Foot Pumps and TED hose Weight-Bearing as tolerated to right leg   Reche Dixon PA-C [place] 01/19/2019, 7:27 AM

## 2019-01-19 NOTE — Progress Notes (Signed)
Spoke to [place] from [facility] and gave him report on patient. He said they would be here in ~35min

## 2019-01-19 NOTE — Progress Notes (Addendum)
Spoke to Joshua Lynn at [facility] to give report. Patient is going to room 2DCT15 and the accepting physician is Dr. Brett Albino. [facility] called. Patient was put on a list and they are not sure how long it will be before patient can be transported. Dispatch said they would keep [place] updated.

## 2019-01-19 NOTE — Progress Notes (Signed)
Pt Has a bed a [facility] #[id number] in the newly built tower. Report number [phone]. Dr. Lonny Prude is the accepting doctor. EMTALA form to be done by MD.

## 2019-01-20 LAB — SURGICAL PATHOLOGY
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
## 2019-01-21 LAB — CULTURE, BLOOD (ROUTINE X 2)
Culture: NO GROWTH
Culture: NO GROWTH
Special Requests: ADEQUATE
Special Requests: ADEQUATE

## 2019-01-22 ENCOUNTER — Other Ambulatory Visit: Payer: Self-pay | Admitting: *Deleted

## 2019-01-22 DIAGNOSIS — R5381 Other malaise: Secondary | ICD-10-CM | POA: Diagnosis not present

## 2019-01-22 NOTE — Patient Outreach (Signed)
[facility]) Care Management  01/22/2019  ADIS STURGILL [birth year] [id number]   Subjective: Telephone call to patient's home  / mobile number, no answer, message states no voicemail set up, and unable to leave a message.     Objective: Per KPN (Knowledge Performance Now, point of care tool) and chart review, patient hospitalized 01/14/2019 - 01/19/2019 for fall, Closed fracture of right hip, status post ANTERIOR APPROACH HEMI HIP ARTHROPLASTY (Right) on 01/16/2019.  Patient also has a history of hypertension and alcohol abuse.      Assessment: Received Medicare EMMI General Discharge Red Alert Flag follow up referral on 01/22/2019. Red Flag Alert Trigger, Day #1, patient answered no to the following questions:  Got discharge papers?  Know who to call about changes in condition?  [facility] EMMI follow up pending patient contact.      Plan: RNCM will send unsuccessful outreach letter, [facility] pamphlet, handout: Know Before You Go, will call patient for 2nd telephone outreach attempt within 4 business days, [facility] EMMI follow up, and proceed with case closure, within 10 business days if no return call.      Janit Cutter H. Annia Friendly, BSN, [place] Management [facility] Telephonic CM Phone: [phone] Fax: [phone]

## 2019-01-23 ENCOUNTER — Other Ambulatory Visit: Payer: Self-pay | Admitting: *Deleted

## 2019-01-23 NOTE — Patient Outreach (Addendum)
[facility]) Care Management  01/23/2019  Joshua Lynn [birth year] [id number]   Subjective: Telephone call to patient's home / mobile number, spoke with male answering phone, states Joshua Lynn is currently in [place] in rehab, not available at this time, and unknown discharge date.  Left HIPAA compliant message for Joshua Lynn and requested call back.   Objective: Per KPN (Knowledge Performance Now, point of care tool) and chart review, patient hospitalized 01/14/2019 - 01/19/2019 for fall, Closed fracture of right hip, status post ANTERIOR APPROACH HEMI HIP ARTHROPLASTY (Right) on 01/16/2019.  Patient also has a history of hypertension and alcohol abuse.      Assessment: Received Medicare EMMI General Discharge Red Alert Flag follow up referral on 01/22/2019. Red Flag Alert Trigger, Day #1, patient answered no to the following questions:  Got discharge papers?  Know who to call about changes in condition?  EMMI follow up not completed due to unable to contact patient and will proceed with case closure due to patient enrolled in external program (currently in rehab).      Plan:  RNCM will close case due to patient enrolled in another CM program. RNCM will send in basket message to [facility] and [facility] to discontinue patient's EMMI automated calls due to patient being in a rehab.      Abbe Bula H. Annia Friendly, BSN, [place] Management [facility] Telephonic CM Phone: [phone] Fax: [phone]

## 2019-01-24 DIAGNOSIS — R5381 Other malaise: Secondary | ICD-10-CM | POA: Diagnosis not present

## 2019-01-27 DIAGNOSIS — R5381 Other malaise: Secondary | ICD-10-CM | POA: Diagnosis not present

## 2019-01-29 DIAGNOSIS — R5381 Other malaise: Secondary | ICD-10-CM | POA: Diagnosis not present

## 2019-02-04 DIAGNOSIS — R5381 Other malaise: Secondary | ICD-10-CM | POA: Diagnosis not present

## 2019-02-07 DIAGNOSIS — R5381 Other malaise: Secondary | ICD-10-CM | POA: Diagnosis not present

## 2019-02-12 DIAGNOSIS — R5381 Other malaise: Secondary | ICD-10-CM | POA: Diagnosis not present

## 2019-02-13 ENCOUNTER — Inpatient Hospital Stay
Admission: EM | Admit: 2019-02-13 | Discharge: 2019-02-14 | DRG: 964 | Disposition: A | Discharge status: Other Acute Inpatient Hospital | Payer: Medicare Other | Attending: Nurse Practitioner | Admitting: Nurse Practitioner

## 2019-02-13 ENCOUNTER — Emergency Department: Payer: Medicare Other

## 2019-02-13 ENCOUNTER — Encounter: Payer: Self-pay | Admitting: *Deleted

## 2019-02-13 ENCOUNTER — Other Ambulatory Visit: Payer: Self-pay

## 2019-02-13 DIAGNOSIS — I10 Essential (primary) hypertension: Secondary | ICD-10-CM | POA: Diagnosis not present

## 2019-02-13 DIAGNOSIS — M9701XA Periprosthetic fracture around internal prosthetic right hip joint, initial encounter: Secondary | ICD-10-CM | POA: Diagnosis present

## 2019-02-13 DIAGNOSIS — M25572 Pain in left ankle and joints of left foot: Secondary | ICD-10-CM | POA: Diagnosis not present

## 2019-02-13 DIAGNOSIS — W19XXXA Unspecified fall, initial encounter: Secondary | ICD-10-CM | POA: Diagnosis not present

## 2019-02-13 DIAGNOSIS — Z01818 Encounter for other preprocedural examination: Secondary | ICD-10-CM | POA: Diagnosis not present

## 2019-02-13 DIAGNOSIS — Z20828 Contact with and (suspected) exposure to other viral communicable diseases: Secondary | ICD-10-CM | POA: Diagnosis present

## 2019-02-13 DIAGNOSIS — Z9181 History of falling: Secondary | ICD-10-CM

## 2019-02-13 DIAGNOSIS — Z03818 Encounter for observation for suspected exposure to other biological agents ruled out: Secondary | ICD-10-CM | POA: Diagnosis not present

## 2019-02-13 DIAGNOSIS — S79911A Unspecified injury of right hip, initial encounter: Secondary | ICD-10-CM | POA: Diagnosis not present

## 2019-02-13 DIAGNOSIS — W1830XA Fall on same level, unspecified, initial encounter: Secondary | ICD-10-CM | POA: Diagnosis present

## 2019-02-13 DIAGNOSIS — M25551 Pain in right hip: Secondary | ICD-10-CM | POA: Diagnosis not present

## 2019-02-13 DIAGNOSIS — S065X0A Traumatic subdural hemorrhage without loss of consciousness, initial encounter: Secondary | ICD-10-CM | POA: Diagnosis not present

## 2019-02-13 DIAGNOSIS — S72111A Displaced fracture of greater trochanter of right femur, initial encounter for closed fracture: Secondary | ICD-10-CM | POA: Diagnosis not present

## 2019-02-13 DIAGNOSIS — S199XXA Unspecified injury of neck, initial encounter: Secondary | ICD-10-CM | POA: Diagnosis not present

## 2019-02-13 DIAGNOSIS — S299XXA Unspecified injury of thorax, initial encounter: Secondary | ICD-10-CM | POA: Diagnosis not present

## 2019-02-13 DIAGNOSIS — S065X9A Traumatic subdural hemorrhage with loss of consciousness of unspecified duration, initial encounter: Principal | ICD-10-CM | POA: Diagnosis present

## 2019-02-13 DIAGNOSIS — Z8781 Personal history of (healed) traumatic fracture: Secondary | ICD-10-CM

## 2019-02-13 DIAGNOSIS — S72031A Displaced midcervical fracture of right femur, initial encounter for closed fracture: Secondary | ICD-10-CM | POA: Diagnosis not present

## 2019-02-13 DIAGNOSIS — Z79899 Other long term (current) drug therapy: Secondary | ICD-10-CM

## 2019-02-13 DIAGNOSIS — S72001A Fracture of unspecified part of neck of right femur, initial encounter for closed fracture: Secondary | ICD-10-CM | POA: Diagnosis present

## 2019-02-13 DIAGNOSIS — T84020A Dislocation of internal right hip prosthesis, initial encounter: Secondary | ICD-10-CM | POA: Diagnosis not present

## 2019-02-13 DIAGNOSIS — R52 Pain, unspecified: Secondary | ICD-10-CM | POA: Diagnosis not present

## 2019-02-13 DIAGNOSIS — M979XXA Periprosthetic fracture around unspecified internal prosthetic joint, initial encounter: Secondary | ICD-10-CM | POA: Diagnosis not present

## 2019-02-13 LAB — BASIC METABOLIC PANEL
Anion gap: 14 (ref 5–15)
BUN: 24 mg/dL — ABNORMAL HIGH (ref 8–23)
CO2: 18 mmol/L — ABNORMAL LOW (ref 22–32)
Calcium: 8.8 mg/dL — ABNORMAL LOW (ref 8.9–10.3)
Chloride: 95 mmol/L — ABNORMAL LOW (ref 98–111)
Creatinine, Ser: 0.87 mg/dL (ref 0.61–1.24)
GFR calc Af Amer: >60 mL/min (ref >60)
GFR calc non Af Amer: >60 mL/min (ref >60)
Glucose, Bld: 91 mg/dL (ref 70–99)
Potassium: 3.8 mmol/L (ref 3.5–5.1)
Sodium: 127 mmol/L — ABNORMAL LOW (ref 135–145)

## 2019-02-13 LAB — CBC
HCT: 28 % — ABNORMAL LOW (ref 39.0–52.0)
Hemoglobin: 9.4 g/dL — ABNORMAL LOW (ref 13.0–17.0)
MCH: 30.6 pg (ref 26.0–34.0)
MCHC: 33.6 g/dL (ref 30.0–36.0)
MCV: 91.2 fL (ref 80.0–100.0)
Platelets: 333 10*3/uL (ref 150–400)
RBC: 3.07 MIL/uL — ABNORMAL LOW (ref 4.22–5.81)
RDW: 13.3 % (ref 11.5–15.5)
WBC: 10.1 10*3/uL (ref 4.0–10.5)
nRBC: 0 % (ref 0.0–0.2)

## 2019-02-13 LAB — SAMPLE TO BLOOD BANK

## 2019-02-13 LAB — SARS CORONAVIRUS 2 BY RT PCR (HOSPITAL ORDER, PERFORMED IN ~~LOC~~ HOSPITAL LAB): SARS Coronavirus 2: NEGATIVE

## 2019-02-13 MED ORDER — FENTANYL CITRATE (PF) 100 MCG/2ML IJ SOLN
100.0000 ug | Freq: Once | INTRAMUSCULAR | Status: AC
  Administered 2019-02-13: 21:00:00 100 ug via INTRAVENOUS
  Filled 2019-02-13: qty 2

## 2019-02-13 MED ORDER — ONDANSETRON HCL 4 MG/2ML IJ SOLN
4.0000 mg | Freq: Once | INTRAMUSCULAR | Status: AC
  Administered 2019-02-13: 4 mg via INTRAVENOUS
  Filled 2019-02-13: qty 2

## 2019-02-13 NOTE — ED Provider Notes (Signed)
[facility] Emergency Department Provider Note  Time seen: 8:41 PM  I have reviewed the triage vital signs and the nursing notes.   HISTORY  Chief Complaint Fall Right hip pain   HPI Joshua Lynn is a 78 y.o. male with a past medical history of alcohol use, anxiety, hypertension, presents emergency department for hip pain.  According to the patient 1 week ago he had a fall onto his right hip, states some pain in the area but was able to ambulate well.  States tonight he had a fall again onto the right hip with significant pain, has been unable to move the right hip due to pain.  Denies hitting his head.  Denies LOC.  Denies any fever cough congestion or shortness of breath.   Past Medical History:  Diagnosis Date  . Alcohol use   . Anxiety   . GERD (gastroesophageal reflux disease)   . Hypertension     Patient Active Problem List   Diagnosis Date Noted  . Closed right hip fracture ([place]) 01/14/2019  . Alcohol abuse 01/14/2019  . HTN (hypertension) 01/14/2019  . Anxiety 01/14/2019  . GERD (gastroesophageal reflux disease) 01/14/2019    Past Surgical History:  Procedure Laterality Date  . ANTERIOR APPROACH HEMI HIP ARTHROPLASTY Right 01/16/2019   Procedure: ANTERIOR APPROACH HEMI HIP ARTHROPLASTY;  Surgeon: Hessie Knows, MD;  Location: [facility] ORS;  Service: Orthopedics;  Laterality: Right;  . RIGHT eye extraction       Prior to Admission medications   Medication Sig Start Date End Date Taking? Authorizing Provider  alum & mag hydroxide-simeth (MAALOX/MYLANTA) 200-200-20 MG/5ML suspension Take 30 mLs by mouth every 4 (four) hours as needed for indigestion. 01/17/19   Gladstone Lighter, MD  cefTRIAXone 1 g in sodium chloride 0.9 % 100 mL Inject 1 g into the vein daily. 01/18/19   Gladstone Lighter, MD  doxazosin (CARDURA) 4 MG tablet Take 4 mg by mouth at bedtime. 09/20/18   [provider]  HYDROcodone-acetaminophen (NORCO/VICODIN) 5-325 MG  tablet Take 1-2 tablets by mouth every 4 (four) hours as needed for moderate pain (pain score 4-6). 01/17/19   Gladstone Lighter, MD  magnesium hydroxide (MILK OF MAGNESIA) 400 MG/5ML suspension Take 30 mLs by mouth daily as needed for mild constipation. 01/17/19   Gladstone Lighter, MD  methocarbamol (ROBAXIN) 500 MG tablet Take 1 tablet (500 mg total) by mouth every 6 (six) hours as needed for muscle spasms. 01/17/19   Gladstone Lighter, MD  ondansetron (ZOFRAN) 4 MG/2ML SOLN injection Inject 2 mLs (4 mg total) into the vein every 6 (six) hours as needed for nausea. 01/17/19   Gladstone Lighter, MD    No Known Allergies  Family History  Problem Relation Age of Onset  . Alzheimer's disease Mother   . Suicidality Father   . Suicidality Brother     Social History Social History   Tobacco Use  . Smoking status: Never Smoker  . Smokeless tobacco: Never Used  Substance Use Topics  . Alcohol use: Yes  . Drug use: Never    Review of Systems Constitutional: Negative for fever. ENT: Negative for recent illness/congestion Cardiovascular: Negative for chest pain. Respiratory: Negative for shortness of breath.  For cough. Gastrointestinal: Negative for abdominal pain, vomiting  Musculoskeletal: Negative for musculoskeletal complaints Skin: Negative for skin complaints  Neurological: Negative for headache All other ROS negative  ____________________________________________   PHYSICAL EXAM:  VITAL SIGNS: ED Triage Vitals [02/13/19 2026]  Enc Vitals Group  BP (!) 164/87     Pulse Rate 79     Resp 20     Temp 98.3 F (36.8 C)     Temp Source Oral     SpO2 100 %     Weight      Height      Head Circumference      Peak Flow      Pain Score 5     Pain Loc      Pain Edu?      Excl. in [place]?    Constitutional: Alert and oriented. Well appearing and in no distress. Eyes: Normal exam ENT      Head: Normocephalic and atraumatic.      Mouth/Throat: Mucous membranes are  moist. Cardiovascular: Normal rate, regular rhythm. Respiratory: Normal respiratory effort without tachypnea nor retractions. Breath sounds are clear  Gastrointestinal: Soft and nontender. No distention.   Musculoskeletal: Moderate right hip tenderness palpation, significant pain with any attempted range of motion.  Neurovascular intact distally.  Shortened and externally rotated right hip. Neurologic:  Normal speech and language. No gross focal neurologic deficits Skin:  Skin is warm, dry and intact.  Psychiatric: Mood and affect are normal.   ____________________________________________    EKG  EKG viewed and interpreted by myself shows a normal sinus rhythm at 81 bpm with a narrow QRS, normal axis, normal intervals, no concerning ST changes.  ____________________________________________    RADIOLOGY  Periprosthetic right hip fracture.  ____________________________________________   INITIAL IMPRESSION / ASSESSMENT AND PLAN / ED COURSE  Pertinent labs & imaging results that were available during my care of the patient were reviewed by me and considered in my medical decision making (see chart for details).   Patient presents emergency department after a fall with right hip pain.  Patient's exam unfortunately is very concerning for right hip fracture.  Neurovascular intact distally.  We will check labs, chest x-ray, right hip films, treat pain and continue to closely monitor.  Anticipate likely admission.  Record review shows patient had a recent right hip surgery performed by Dr. Rudene Christians 01/18/2019.  X-ray today shows periprosthetic fracture around the hardware.  Patient will be admitted to the hospital service, I discussed the patient with Dr. Roland Rack of orthopedics.  Joshua Lynn was evaluated in Emergency Department on 02/13/2019 for the symptoms described in the history of present illness. He was evaluated in the context of the global COVID-19 pandemic, which necessitated  consideration that the patient might be at risk for infection with the SARS-CoV-2 virus that causes COVID-19. Institutional protocols and algorithms that pertain to the evaluation of patients at risk for COVID-19 are in a state of rapid change based on information released by regulatory bodies including the CDC and federal and state organizations. These policies and algorithms were followed during the patient's care in the ED.  ____________________________________________   FINAL CLINICAL IMPRESSION(S) / ED DIAGNOSES  Fall Right hip fracture   Harvest Dark, MD 02/13/19 2223

## 2019-02-13 NOTE — ED Notes (Signed)
ED TO INPATIENT HANDOFF REPORT  ED Nurse Name and Phone #: Karena Addison [phone]  S Name/Age/Gender Joshua Lynn 78 y.o. male Room/Bed: ED02A/ED02A  Code Status   Code Status: Prior  Home/SNF/Other Home Patient oriented to: self, place, time and situation Is this baseline? Yes   Triage Complete: Triage complete  Chief Complaint Fall  Triage Note Pt brought in via ems from home.  Pt fell today at home on a hardwood floor.  Pt has shortening and rotation of right leg/hip.  Pt alert.  Pt was released today from [facility] in [place] today for non compliance per ems.   Pt has right hip pain.  Denies loc.  Pt alert    Allergies No Known Allergies  Level of Care/Admitting Diagnosis ED Disposition    ED Disposition Condition Comment   Admit  The patient appears reasonably stabilized for admission considering the current resources, flow, and capabilities available in the ED at this time, and I doubt any other [facility] requiring further screening and/or treatment in the ED prior to admission is  present.       B Medical/Surgery History Past Medical History:  Diagnosis Date  . Alcohol use   . Anxiety   . GERD (gastroesophageal reflux disease)   . Hypertension    Past Surgical History:  Procedure Laterality Date  . ANTERIOR APPROACH HEMI HIP ARTHROPLASTY Right 01/16/2019   Procedure: ANTERIOR APPROACH HEMI HIP ARTHROPLASTY;  Surgeon: Hessie Knows, MD;  Location: [facility] ORS;  Service: Orthopedics;  Laterality: Right;  . RIGHT eye extraction        A IV Location/Drains/Wounds Patient Lines/Drains/Airways Status   Active Line/Drains/Airways    Name:   Placement date:   Placement time:   Site:   Days:   Peripheral IV 01/17/19 Anterior;Proximal;Right Forearm   01/17/19    0300    Forearm   27   Peripheral IV 01/18/19 Right;Anterior Forearm   01/18/19    2231    Forearm   26   Peripheral IV 02/13/19 Left Forearm   02/13/19    2123    Forearm   less than 1   Incision (Closed)  01/16/19 Hip Right   01/16/19    1524     28          Intake/Output Last 24 hours No intake or output data in the 24 hours ending 02/13/19 2345  Labs/Imaging Results for orders placed or performed during the hospital encounter of 02/13/19 (from the past 48 hour(s))  CBC     Status: Abnormal   Collection Time: 02/13/19  8:30 PM  Result Value Ref Range   WBC 10.1 4.0 - 10.5 K/uL   RBC 3.07 (L) 4.22 - 5.81 MIL/uL   Hemoglobin 9.4 (L) 13.0 - 17.0 g/dL   HCT 28.0 (L) 39.0 - 52.0 %   MCV 91.2 80.0 - 100.0 fL   MCH 30.6 26.0 - 34.0 pg   MCHC 33.6 30.0 - 36.0 g/dL   RDW 13.3 11.5 - 15.5 %   Platelets 333 150 - 400 K/uL   nRBC 0.0 0.0 - 0.2 %    Comment: Performed at [facility], [address] [place], [place] [address]  Basic metabolic panel     Status: Abnormal   Collection Time: 02/13/19  8:30 PM  Result Value Ref Range   Sodium 127 (L) 135 - 145 mmol/L   Potassium 3.8 3.5 - 5.1 mmol/L   Chloride 95 (L) 98 - 111 mmol/L  CO2 18 (L) 22 - 32 mmol/L   Glucose, Bld 91 70 - 99 mg/dL   BUN 24 (H) 8 - 23 mg/dL   Creatinine, Ser 0.87 0.61 - 1.24 mg/dL   Calcium 8.8 (L) 8.9 - 10.3 mg/dL   GFR calc non Af Amer >60 >60 mL/min   GFR calc Af Amer >60 >60 mL/min   Anion gap 14 5 - 15    Comment: Performed at [facility], [address] [place], [place] [address]  Sample to Blood Bank     Status: None   Collection Time: 02/13/19  8:30 PM  Result Value Ref Range   Blood Bank Specimen SAMPLE AVAILABLE FOR TESTING    Sample Expiration      02/16/2019,2359 Performed at [facility], [address], [place],  [address]   SARS Coronavirus 2 (CEPHEID - Performed in [facility] hospital lab), Hosp Order     Status: None   Collection Time: 02/13/19  8:51 PM   Specimen: Nasopharyngeal Swab  Result Value Ref Range   SARS Coronavirus 2 NEGATIVE NEGATIVE    Comment: (NOTE) If result is NEGATIVE SARS-CoV-2 target nucleic acids are NOT DETECTED. The  SARS-CoV-2 RNA is generally detectable in upper and lower  respiratory specimens during the acute phase of infection. The lowest  concentration of SARS-CoV-2 viral copies this assay can detect is 250  copies / mL. A negative result does not preclude SARS-CoV-2 infection  and should not be used as the sole basis for treatment or other  patient management decisions.  A negative result may occur with  improper specimen collection / handling, submission of specimen other  than nasopharyngeal swab, presence of viral mutation(s) within the  areas targeted by this assay, and inadequate number of viral copies  (<250 copies / mL). A negative result must be combined with clinical  observations, patient history, and epidemiological information. If result is POSITIVE SARS-CoV-2 target nucleic acids are DETECTED. The SARS-CoV-2 RNA is generally detectable in upper and lower  respiratory specimens dur ing the acute phase of infection.  Positive  results are indicative of active infection with SARS-CoV-2.  Clinical  correlation with patient history and other diagnostic information is  necessary to determine patient infection status.  Positive results do  not rule out bacterial infection or co-infection with other viruses. If result is PRESUMPTIVE POSTIVE SARS-CoV-2 nucleic acids MAY BE PRESENT.   A presumptive positive result was obtained on the submitted specimen  and confirmed on repeat testing.  While 2019 novel coronavirus  (SARS-CoV-2) nucleic acids may be present in the submitted sample  additional confirmatory testing may be necessary for epidemiological  and / or clinical management purposes  to differentiate between  SARS-CoV-2 and other Sarbecovirus currently known to infect humans.  If clinically indicated additional testing with an alternate test  methodology [phone]) is advised. The SARS-CoV-2 RNA is generally  detectable in upper and lower respiratory sp ecimens during the acute   phase of infection. The expected result is Negative. Fact Sheet for Patients:  StrictlyIdeas.no Fact Sheet for Healthcare Providers: BankingDealers.co.za This test is not yet approved or cleared by the [place] FDA and has been authorized for detection and/or diagnosis of SARS-CoV-2 by FDA under an Emergency Use Authorization (EUA).  This EUA will remain in effect (meaning this test can be used) for the duration of the COVID-19 declaration under Section 564(b)(1) of the Act, 21 U.S.C. section 360bbb-3(b)(1), unless the authorization is terminated or revoked sooner.  Performed at [facility], [address] [place], [place] [address]    Dg Chest 1 View  Result Date: 02/13/2019 CLINICAL DATA:  Preop EXAM: CHEST  1 VIEW COMPARISON:  01/14/2019 FINDINGS: The heart size and mediastinal contours are within normal limits. Both lungs are clear. The visualized skeletal structures are unremarkable. IMPRESSION: No active disease. Electronically Signed   By: Rolm Baptise M.D.   On: 02/13/2019 21:23   Ct Head Wo Contrast  Result Date: 02/13/2019 CLINICAL DATA:  Head trauma, fall EXAM: CT HEAD WITHOUT CONTRAST TECHNIQUE: Contiguous axial images were obtained from the base of the skull through the vertex without intravenous contrast. COMPARISON:  01/18/2019 FINDINGS: Brain: Previously seen mixed density subdural hematoma on the left has increased in size with a thickness now measuring 2 cm compared with 1 cm previously. This is predominantly low-density with scatter higher density areas noted. Mass-effect and 5-6 mm of left-to-right midline shift noted, increased since prior study as well. There is atrophy and chronic small vessel disease changes. No hydrocephalus. Vascular: No hyperdense vessel or unexpected calcification. Skull: No acute calvarial abnormality. Sinuses/Orbits: No acute findings Other: None IMPRESSION: Enlarging mixed density  subdural hematoma over much of the left cerebral hemisphere, now 2 cm. 5-6 mm of left-to-right midline shift. Atrophy, chronic small vessel disease. These results were called by telephone at the time of interpretation on 02/13/2019 at 11:29 pm to Dr. Marjean Donna , who verbally acknowledged these results. Electronically Signed   By: Rolm Baptise M.D.   On: 02/13/2019 23:32   Dg Hip Unilat With Pelvis 2-3 Views Right  Result Date: 02/13/2019 CLINICAL DATA:  Fall at home with shortening and rotation of the right leg and hip EXAM: DG HIP (WITH OR WITHOUT PELVIS) 2-3V RIGHT COMPARISON:  01/16/2019 FINDINGS: Status post right hip replacement without dislocation. Acute periprosthetic fracture involving the proximal shaft of the femur. Mild varus angulation of distal fracture fragment and close to 1 bone with of anterior and medial displacement of distal fracture fragment. IMPRESSION: Status post right hip replacement with acute displaced and slightly angulated periprosthetic fracture involving the proximal shaft of the femur Electronically Signed   By: Donavan Foil M.D.   On: 02/13/2019 21:29    Pending Labs [employer] (From admission, onward)    Start     Ordered   Signed and Held  CBC  Tomorrow morning,   R     Signed and Held   Signed and Occupational hygienist morning,   R     Signed and Held   Signed and Held  Type and screen [place]  Once,   R    Comments: [place]    Signed and Held          Vitals/Pain Today's Vitals   02/13/19 2124 02/13/19 2152 02/13/19 2200 02/13/19 2245  BP:   (!) 178/103   Pulse: 91 88 83 82  Resp: (!) 25 20 (!) 23 (!) 22  Temp:      TempSrc:      SpO2: 100% 99% 100% 100%  PainSc:        Isolation Precautions No active isolations  Medications Medications  fentaNYL (SUBLIMAZE) injection 100 mcg (100 mcg Intravenous Given 02/13/19 2122)  ondansetron (ZOFRAN) injection 4 mg (4 mg  Intravenous Given 02/13/19 2123)    Mobility non-ambulatory High fall risk   Focused Assessments    R Recommendations: See Admitting Provider Note  Report given to:

## 2019-02-13 NOTE — ED Notes (Signed)
Pt has right hip/leg pain after falling on a hardwood floor at home today.  No loc.  Pt drinks etoh but unsure if today.  Shortening and rotation noted of right leg.  Pt alert  Iv started.  nsr on monitor. siderails up x 2.

## 2019-02-13 NOTE — ED Triage Notes (Addendum)
Pt brought in via ems from home.  Pt fell today at home on a hardwood floor.  Pt has shortening and rotation of right leg/hip.  Pt alert.  Pt was released today from [facility] in [place] today for non compliance per ems.   Pt has right hip pain.  Denies loc.  Pt alert

## 2019-02-13 NOTE — ED Notes (Signed)
Pain meds given

## 2019-02-13 NOTE — ED Notes (Signed)
Pt waiting on admission.  Pt resting quietly. siderails up x 2.  nsr on monitor.

## 2019-02-14 DIAGNOSIS — Z9181 History of falling: Secondary | ICD-10-CM | POA: Diagnosis not present

## 2019-02-14 DIAGNOSIS — S72111A Displaced fracture of greater trochanter of right femur, initial encounter for closed fracture: Secondary | ICD-10-CM | POA: Diagnosis not present

## 2019-02-14 DIAGNOSIS — S065X9A Traumatic subdural hemorrhage with loss of consciousness of unspecified duration, initial encounter: Secondary | ICD-10-CM | POA: Diagnosis present

## 2019-02-14 DIAGNOSIS — S72001A Fracture of unspecified part of neck of right femur, initial encounter for closed fracture: Secondary | ICD-10-CM | POA: Diagnosis present

## 2019-02-14 DIAGNOSIS — Z20828 Contact with and (suspected) exposure to other viral communicable diseases: Secondary | ICD-10-CM | POA: Diagnosis present

## 2019-02-14 DIAGNOSIS — R52 Pain, unspecified: Secondary | ICD-10-CM | POA: Diagnosis not present

## 2019-02-14 DIAGNOSIS — W1830XA Fall on same level, unspecified, initial encounter: Secondary | ICD-10-CM | POA: Diagnosis present

## 2019-02-14 DIAGNOSIS — S065X0A Traumatic subdural hemorrhage without loss of consciousness, initial encounter: Secondary | ICD-10-CM | POA: Diagnosis not present

## 2019-02-14 DIAGNOSIS — S299XXA Unspecified injury of thorax, initial encounter: Secondary | ICD-10-CM | POA: Diagnosis not present

## 2019-02-14 DIAGNOSIS — M979XXA Periprosthetic fracture around unspecified internal prosthetic joint, initial encounter: Secondary | ICD-10-CM | POA: Diagnosis not present

## 2019-02-14 DIAGNOSIS — Z7401 Bed confinement status: Secondary | ICD-10-CM | POA: Diagnosis not present

## 2019-02-14 DIAGNOSIS — M9701XA Periprosthetic fracture around internal prosthetic right hip joint, initial encounter: Secondary | ICD-10-CM | POA: Diagnosis present

## 2019-02-14 DIAGNOSIS — S199XXA Unspecified injury of neck, initial encounter: Secondary | ICD-10-CM | POA: Diagnosis not present

## 2019-02-14 DIAGNOSIS — M25551 Pain in right hip: Secondary | ICD-10-CM | POA: Diagnosis not present

## 2019-02-14 DIAGNOSIS — W19XXXA Unspecified fall, initial encounter: Secondary | ICD-10-CM | POA: Diagnosis not present

## 2019-02-14 DIAGNOSIS — I10 Essential (primary) hypertension: Secondary | ICD-10-CM | POA: Diagnosis present

## 2019-02-14 DIAGNOSIS — R41841 Cognitive communication deficit: Secondary | ICD-10-CM | POA: Diagnosis not present

## 2019-02-14 DIAGNOSIS — M2550 Pain in unspecified joint: Secondary | ICD-10-CM | POA: Diagnosis not present

## 2019-02-14 DIAGNOSIS — Z79899 Other long term (current) drug therapy: Secondary | ICD-10-CM | POA: Diagnosis not present

## 2019-02-14 DIAGNOSIS — S72031A Displaced midcervical fracture of right femur, initial encounter for closed fracture: Secondary | ICD-10-CM | POA: Diagnosis not present

## 2019-02-14 MED ORDER — ADULT MULTIVITAMIN W/MINERALS CH: 1.0000 | ORAL_TABLET | Freq: Every day | ORAL | Status: DC

## 2019-02-14 MED ORDER — POTASSIUM CHLORIDE CRYS ER 20 MEQ PO TBCR: 40.0000 meq | EXTENDED_RELEASE_TABLET | Freq: Every day | ORAL | Status: DC

## 2019-02-14 MED ORDER — THIAMINE HCL 100 MG PO TABS: 100.0000 mg | ORAL_TABLET | Freq: Every day | ORAL | Status: DC

## 2019-02-14 MED ORDER — MORPHINE SULFATE (PF) 2 MG/ML IV SOLN: 0.5000 mg | INTRAVENOUS | Status: DC | PRN

## 2019-02-14 MED ORDER — THIAMINE HCL 100 MG/ML IJ SOLN: 100.0000 mg | Freq: Every day | INTRAMUSCULAR | Status: DC

## 2019-02-14 MED ORDER — LORAZEPAM 2 MG/ML IJ SOLN: 1.0000 mg | Freq: Four times a day (QID) | INTRAMUSCULAR | Status: DC | PRN

## 2019-02-14 MED ORDER — DEXAMETHASONE SODIUM PHOSPHATE 10 MG/ML IJ SOLN
20.0000 mg | Freq: Once | INTRAMUSCULAR | Status: AC
  Administered 2019-02-14: 20 mg via INTRAVENOUS

## 2019-02-14 MED ORDER — DOXAZOSIN MESYLATE 4 MG PO TABS: 4.0000 mg | ORAL_TABLET | Freq: Every day | ORAL | Status: DC

## 2019-02-14 MED ORDER — LORAZEPAM 1 MG PO TABS: 1.0000 mg | ORAL_TABLET | Freq: Four times a day (QID) | ORAL | Status: DC | PRN

## 2019-02-14 MED ORDER — MELATONIN 3 MG PO TABS: 1.0000 | ORAL_TABLET | Freq: Every day | ORAL | Status: DC

## 2019-02-14 MED ORDER — DEXAMETHASONE SODIUM PHOSPHATE 10 MG/ML IJ SOLN
INTRAMUSCULAR | Status: AC
  Administered 2019-02-14: 20 mg via INTRAVENOUS
  Filled 2019-02-14: qty 1

## 2019-02-14 MED ORDER — FOLIC ACID 1 MG PO TABS: 1.0000 mg | ORAL_TABLET | Freq: Every day | ORAL | Status: DC

## 2019-02-14 MED ORDER — MORPHINE SULFATE (PF) 4 MG/ML IV SOLN
4.0000 mg | Freq: Once | INTRAVENOUS | Status: AC
  Administered 2019-02-14: 4 mg via INTRAVENOUS
  Filled 2019-02-14: qty 1

## 2019-02-14 MED ORDER — DOCUSATE SODIUM 100 MG PO CAPS: 200.0000 mg | ORAL_CAPSULE | Freq: Two times a day (BID) | ORAL | Status: DC

## 2019-02-14 MED ORDER — VITAMIN B-1 100 MG PO TABS: 100.0000 mg | ORAL_TABLET | Freq: Every day | ORAL | Status: DC

## 2019-02-14 MED ORDER — ALUM & MAG HYDROXIDE-SIMETH 200-200-20 MG/5ML PO SUSP: 30.0000 mL | ORAL | Status: DC | PRN

## 2019-02-14 MED ORDER — DEXAMETHASONE SODIUM PHOSPHATE 10 MG/ML IJ SOLN
INTRAMUSCULAR | Status: AC
  Filled 2019-02-14: qty 1

## 2019-02-14 MED ORDER — HYDROCODONE-ACETAMINOPHEN 5-325 MG PO TABS: 1.0000 | ORAL_TABLET | Freq: Four times a day (QID) | ORAL | Status: DC | PRN

## 2019-02-14 MED ORDER — METHOCARBAMOL 500 MG PO TABS: 500.0000 mg | ORAL_TABLET | Freq: Four times a day (QID) | ORAL | Status: DC | PRN

## 2019-02-14 MED ORDER — MAGNESIUM OXIDE 400 (241.3 MG) MG PO TABS: 400.0000 mg | ORAL_TABLET | Freq: Every day | ORAL | Status: DC

## 2019-02-14 MED ORDER — POLYETHYLENE GLYCOL 3350 17 G PO PACK: 17.0000 g | PACK | Freq: Every day | ORAL | Status: DC | PRN

## 2019-02-14 MED ORDER — SODIUM CHLORIDE 0.9 % IV SOLN: INTRAVENOUS | Status: DC

## 2019-02-14 NOTE — Progress Notes (Signed)
TOC CM reviewed referral, pt was transferred to [facility]. Jonnie Finner RN CCM Case Mgmt phone [phone]

## 2019-02-14 NOTE — ED Provider Notes (Signed)
I assumed care of the patient from Dr. Kerman Passey.  Patient with a right periprosthetic hip fracture.  Patient admits to fall tonight with resultant right hip pain.  X-ray revealed beforementioned.  CT scan of the patient's head revealed enlargement with mixed density subdural hematoma that is 2 cm with  6 mm of left-to-right midline shift.  Patient discussed with Dr. Cari Caraway neurosurgery who recommended transfer to do.  Patient subsequently discussed with Mariann Laster at the [facility] transfer center who all accepted the patient on behalf of Dr. Edwina Barth.  CRITICAL CARE Performed by: Gregor Hams   Total critical care time: 30 minutes  Critical care time was exclusive of separately billable procedures and treating other patients.  Critical care was necessary to treat or prevent imminent or life-threatening deterioration.  Critical care was time spent personally by me on the following activities: development of treatment plan with patient and/or surrogate as well as nursing, discussions with consultants, evaluation of patient's response to treatment, examination of patient, obtaining history from patient or surrogate, ordering and performing treatments and interventions, ordering and review of laboratory studies, ordering and review of radiographic studies, pulse oximetry and re-evaluation of patient's condition.    Gregor Hams, MD 02/14/19 [phone]

## 2019-02-15 DIAGNOSIS — S065X9A Traumatic subdural hemorrhage with loss of consciousness of unspecified duration, initial encounter: Secondary | ICD-10-CM | POA: Diagnosis not present

## 2019-02-15 DIAGNOSIS — W1839XA Other fall on same level, initial encounter: Secondary | ICD-10-CM | POA: Diagnosis not present

## 2019-02-15 DIAGNOSIS — R41841 Cognitive communication deficit: Secondary | ICD-10-CM | POA: Diagnosis not present

## 2019-02-15 DIAGNOSIS — S72031A Displaced midcervical fracture of right femur, initial encounter for closed fracture: Secondary | ICD-10-CM | POA: Diagnosis not present

## 2019-02-15 DIAGNOSIS — W19XXXA Unspecified fall, initial encounter: Secondary | ICD-10-CM | POA: Diagnosis not present

## 2019-02-16 DIAGNOSIS — S72031A Displaced midcervical fracture of right femur, initial encounter for closed fracture: Secondary | ICD-10-CM | POA: Diagnosis not present

## 2019-02-16 DIAGNOSIS — S065X9A Traumatic subdural hemorrhage with loss of consciousness of unspecified duration, initial encounter: Secondary | ICD-10-CM | POA: Diagnosis not present

## 2019-02-16 DIAGNOSIS — R569 Unspecified convulsions: Secondary | ICD-10-CM | POA: Diagnosis not present

## 2019-02-16 DIAGNOSIS — Y33XXXA Other specified events, undetermined intent, initial encounter: Secondary | ICD-10-CM | POA: Diagnosis not present

## 2019-02-17 DIAGNOSIS — S0990XA Unspecified injury of head, initial encounter: Secondary | ICD-10-CM | POA: Diagnosis not present

## 2019-02-17 DIAGNOSIS — S065X9A Traumatic subdural hemorrhage with loss of consciousness of unspecified duration, initial encounter: Secondary | ICD-10-CM | POA: Diagnosis not present

## 2019-02-17 DIAGNOSIS — R41841 Cognitive communication deficit: Secondary | ICD-10-CM | POA: Diagnosis not present

## 2019-02-17 DIAGNOSIS — I6203 Nontraumatic chronic subdural hemorrhage: Secondary | ICD-10-CM | POA: Diagnosis not present

## 2019-02-20 DIAGNOSIS — Z8659 Personal history of other mental and behavioral disorders: Secondary | ICD-10-CM | POA: Diagnosis not present

## 2019-02-20 DIAGNOSIS — Z471 Aftercare following joint replacement surgery: Secondary | ICD-10-CM | POA: Diagnosis not present

## 2019-02-20 DIAGNOSIS — Z87438 Personal history of other diseases of male genital organs: Secondary | ICD-10-CM | POA: Diagnosis not present

## 2019-02-20 DIAGNOSIS — G8918 Other acute postprocedural pain: Secondary | ICD-10-CM | POA: Diagnosis not present

## 2019-02-20 DIAGNOSIS — W19XXXA Unspecified fall, initial encounter: Secondary | ICD-10-CM | POA: Diagnosis not present

## 2019-02-20 DIAGNOSIS — S3991XA Unspecified injury of abdomen, initial encounter: Secondary | ICD-10-CM | POA: Diagnosis not present

## 2019-02-20 DIAGNOSIS — M9701XA Periprosthetic fracture around internal prosthetic right hip joint, initial encounter: Secondary | ICD-10-CM | POA: Diagnosis not present

## 2019-02-20 DIAGNOSIS — S72031A Displaced midcervical fracture of right femur, initial encounter for closed fracture: Secondary | ICD-10-CM | POA: Diagnosis not present

## 2019-02-20 DIAGNOSIS — Z96641 Presence of right artificial hip joint: Secondary | ICD-10-CM | POA: Diagnosis not present

## 2019-02-20 DIAGNOSIS — R918 Other nonspecific abnormal finding of lung field: Secondary | ICD-10-CM | POA: Diagnosis not present

## 2019-02-20 DIAGNOSIS — Z4659 Encounter for fitting and adjustment of other gastrointestinal appliance and device: Secondary | ICD-10-CM | POA: Diagnosis not present

## 2019-02-20 DIAGNOSIS — S065X0A Traumatic subdural hemorrhage without loss of consciousness, initial encounter: Secondary | ICD-10-CM | POA: Diagnosis not present

## 2019-02-20 DIAGNOSIS — Z9181 History of falling: Secondary | ICD-10-CM | POA: Diagnosis not present

## 2019-02-21 DIAGNOSIS — S3991XA Unspecified injury of abdomen, initial encounter: Secondary | ICD-10-CM | POA: Diagnosis not present

## 2019-02-21 DIAGNOSIS — R918 Other nonspecific abnormal finding of lung field: Secondary | ICD-10-CM | POA: Diagnosis not present

## 2019-02-21 DIAGNOSIS — Z9181 History of falling: Secondary | ICD-10-CM | POA: Diagnosis not present

## 2019-02-21 DIAGNOSIS — R41841 Cognitive communication deficit: Secondary | ICD-10-CM | POA: Diagnosis not present

## 2019-02-21 DIAGNOSIS — Z4659 Encounter for fitting and adjustment of other gastrointestinal appliance and device: Secondary | ICD-10-CM | POA: Diagnosis not present

## 2019-02-21 DIAGNOSIS — W19XXXA Unspecified fall, initial encounter: Secondary | ICD-10-CM | POA: Diagnosis not present

## 2019-02-25 DIAGNOSIS — S3991XA Unspecified injury of abdomen, initial encounter: Secondary | ICD-10-CM | POA: Diagnosis not present

## 2019-02-25 DIAGNOSIS — W19XXXA Unspecified fall, initial encounter: Secondary | ICD-10-CM | POA: Diagnosis not present

## 2019-02-25 DIAGNOSIS — R918 Other nonspecific abnormal finding of lung field: Secondary | ICD-10-CM | POA: Diagnosis not present

## 2019-02-26 DIAGNOSIS — S065X9A Traumatic subdural hemorrhage with loss of consciousness of unspecified duration, initial encounter: Secondary | ICD-10-CM | POA: Diagnosis not present

## 2019-02-26 DIAGNOSIS — W19XXXA Unspecified fall, initial encounter: Secondary | ICD-10-CM | POA: Diagnosis not present

## 2019-02-26 DIAGNOSIS — F05 Delirium due to known physiological condition: Secondary | ICD-10-CM | POA: Diagnosis not present

## 2019-02-26 DIAGNOSIS — S72031A Displaced midcervical fracture of right femur, initial encounter for closed fracture: Secondary | ICD-10-CM | POA: Diagnosis not present

## 2019-02-27 DIAGNOSIS — W19XXXA Unspecified fall, initial encounter: Secondary | ICD-10-CM | POA: Diagnosis not present

## 2019-02-27 DIAGNOSIS — R41841 Cognitive communication deficit: Secondary | ICD-10-CM | POA: Diagnosis not present

## 2019-02-27 DIAGNOSIS — S299XXA Unspecified injury of thorax, initial encounter: Secondary | ICD-10-CM | POA: Diagnosis not present

## 2019-02-27 DIAGNOSIS — S3991XA Unspecified injury of abdomen, initial encounter: Secondary | ICD-10-CM | POA: Diagnosis not present

## 2019-02-28 ENCOUNTER — Other Ambulatory Visit: Payer: Self-pay

## 2019-02-28 DIAGNOSIS — R410 Disorientation, unspecified: Secondary | ICD-10-CM | POA: Diagnosis not present

## 2019-02-28 DIAGNOSIS — Z4659 Encounter for fitting and adjustment of other gastrointestinal appliance and device: Secondary | ICD-10-CM | POA: Diagnosis not present

## 2019-02-28 DIAGNOSIS — R41841 Cognitive communication deficit: Secondary | ICD-10-CM | POA: Diagnosis not present

## 2019-03-01 DIAGNOSIS — Z4659 Encounter for fitting and adjustment of other gastrointestinal appliance and device: Secondary | ICD-10-CM | POA: Diagnosis not present

## 2019-03-01 DIAGNOSIS — S3991XA Unspecified injury of abdomen, initial encounter: Secondary | ICD-10-CM | POA: Diagnosis not present

## 2019-03-01 DIAGNOSIS — R410 Disorientation, unspecified: Secondary | ICD-10-CM | POA: Diagnosis not present

## 2019-03-01 DIAGNOSIS — W19XXXA Unspecified fall, initial encounter: Secondary | ICD-10-CM | POA: Diagnosis not present

## 2019-03-01 DIAGNOSIS — R41841 Cognitive communication deficit: Secondary | ICD-10-CM | POA: Diagnosis not present

## 2019-03-01 DIAGNOSIS — S3993XA Unspecified injury of pelvis, initial encounter: Secondary | ICD-10-CM | POA: Diagnosis not present

## 2019-03-02 DIAGNOSIS — R410 Disorientation, unspecified: Secondary | ICD-10-CM | POA: Diagnosis not present

## 2019-03-02 DIAGNOSIS — R41841 Cognitive communication deficit: Secondary | ICD-10-CM | POA: Diagnosis not present

## 2019-03-03 DIAGNOSIS — R1312 Dysphagia, oropharyngeal phase: Secondary | ICD-10-CM | POA: Diagnosis not present

## 2019-03-03 DIAGNOSIS — Z9181 History of falling: Secondary | ICD-10-CM | POA: Diagnosis not present

## 2019-03-03 DIAGNOSIS — R41841 Cognitive communication deficit: Secondary | ICD-10-CM | POA: Diagnosis not present

## 2019-03-03 DIAGNOSIS — Z978 Presence of other specified devices: Secondary | ICD-10-CM | POA: Diagnosis not present

## 2019-03-03 DIAGNOSIS — W19XXXA Unspecified fall, initial encounter: Secondary | ICD-10-CM | POA: Diagnosis not present

## 2019-03-03 DIAGNOSIS — T1490XA Injury, unspecified, initial encounter: Secondary | ICD-10-CM | POA: Diagnosis not present

## 2019-03-04 DIAGNOSIS — W19XXXA Unspecified fall, initial encounter: Secondary | ICD-10-CM | POA: Diagnosis not present

## 2019-03-04 DIAGNOSIS — M8588 Other specified disorders of bone density and structure, other site: Secondary | ICD-10-CM | POA: Diagnosis not present

## 2019-03-04 DIAGNOSIS — S065X9A Traumatic subdural hemorrhage with loss of consciousness of unspecified duration, initial encounter: Secondary | ICD-10-CM | POA: Diagnosis not present

## 2019-03-04 DIAGNOSIS — M25531 Pain in right wrist: Secondary | ICD-10-CM | POA: Diagnosis not present

## 2019-03-04 DIAGNOSIS — R1312 Dysphagia, oropharyngeal phase: Secondary | ICD-10-CM | POA: Diagnosis not present

## 2019-03-04 DIAGNOSIS — S72001A Fracture of unspecified part of neck of right femur, initial encounter for closed fracture: Secondary | ICD-10-CM | POA: Diagnosis not present

## 2019-03-04 DIAGNOSIS — R41841 Cognitive communication deficit: Secondary | ICD-10-CM | POA: Diagnosis not present

## 2019-03-06 DIAGNOSIS — W1830XA Fall on same level, unspecified, initial encounter: Secondary | ICD-10-CM | POA: Diagnosis not present

## 2019-03-06 DIAGNOSIS — M9701XA Periprosthetic fracture around internal prosthetic right hip joint, initial encounter: Secondary | ICD-10-CM | POA: Diagnosis not present

## 2019-03-06 DIAGNOSIS — M9702XA Periprosthetic fracture around internal prosthetic left hip joint, initial encounter: Secondary | ICD-10-CM | POA: Diagnosis not present

## 2019-03-06 DIAGNOSIS — N390 Urinary tract infection, site not specified: Secondary | ICD-10-CM | POA: Diagnosis not present

## 2019-03-06 DIAGNOSIS — W19XXXA Unspecified fall, initial encounter: Secondary | ICD-10-CM | POA: Diagnosis not present

## 2019-03-06 DIAGNOSIS — E871 Hypo-osmolality and hyponatremia: Secondary | ICD-10-CM | POA: Diagnosis not present

## 2019-03-06 DIAGNOSIS — G8911 Acute pain due to trauma: Secondary | ICD-10-CM | POA: Diagnosis not present

## 2019-03-06 DIAGNOSIS — N179 Acute kidney failure, unspecified: Secondary | ICD-10-CM | POA: Diagnosis not present

## 2019-03-06 DIAGNOSIS — S065X0A Traumatic subdural hemorrhage without loss of consciousness, initial encounter: Secondary | ICD-10-CM | POA: Diagnosis not present

## 2019-03-07 DIAGNOSIS — Z96641 Presence of right artificial hip joint: Secondary | ICD-10-CM | POA: Diagnosis not present

## 2019-03-07 DIAGNOSIS — M9701XA Periprosthetic fracture around internal prosthetic right hip joint, initial encounter: Secondary | ICD-10-CM | POA: Diagnosis not present

## 2019-03-07 DIAGNOSIS — M9702XA Periprosthetic fracture around internal prosthetic left hip joint, initial encounter: Secondary | ICD-10-CM | POA: Diagnosis not present

## 2019-03-07 DIAGNOSIS — N179 Acute kidney failure, unspecified: Secondary | ICD-10-CM | POA: Diagnosis not present

## 2019-03-07 DIAGNOSIS — E871 Hypo-osmolality and hyponatremia: Secondary | ICD-10-CM | POA: Diagnosis not present

## 2019-03-07 DIAGNOSIS — S065X0A Traumatic subdural hemorrhage without loss of consciousness, initial encounter: Secondary | ICD-10-CM | POA: Diagnosis not present

## 2019-03-07 DIAGNOSIS — N390 Urinary tract infection, site not specified: Secondary | ICD-10-CM | POA: Diagnosis not present

## 2019-03-07 DIAGNOSIS — W19XXXA Unspecified fall, initial encounter: Secondary | ICD-10-CM | POA: Diagnosis not present

## 2019-03-07 DIAGNOSIS — W1830XA Fall on same level, unspecified, initial encounter: Secondary | ICD-10-CM | POA: Diagnosis not present

## 2019-03-07 DIAGNOSIS — G8911 Acute pain due to trauma: Secondary | ICD-10-CM | POA: Diagnosis not present

## 2019-03-08 DIAGNOSIS — M9702XA Periprosthetic fracture around internal prosthetic left hip joint, initial encounter: Secondary | ICD-10-CM | POA: Diagnosis not present

## 2019-03-08 DIAGNOSIS — M9701XA Periprosthetic fracture around internal prosthetic right hip joint, initial encounter: Secondary | ICD-10-CM | POA: Diagnosis not present

## 2019-03-08 DIAGNOSIS — G8911 Acute pain due to trauma: Secondary | ICD-10-CM | POA: Diagnosis not present

## 2019-03-08 DIAGNOSIS — S065X0A Traumatic subdural hemorrhage without loss of consciousness, initial encounter: Secondary | ICD-10-CM | POA: Diagnosis not present

## 2019-03-08 DIAGNOSIS — W1830XA Fall on same level, unspecified, initial encounter: Secondary | ICD-10-CM | POA: Diagnosis not present

## 2019-03-08 DIAGNOSIS — N179 Acute kidney failure, unspecified: Secondary | ICD-10-CM | POA: Diagnosis not present

## 2019-03-08 DIAGNOSIS — W19XXXA Unspecified fall, initial encounter: Secondary | ICD-10-CM | POA: Diagnosis not present

## 2019-03-08 DIAGNOSIS — E871 Hypo-osmolality and hyponatremia: Secondary | ICD-10-CM | POA: Diagnosis not present

## 2019-03-08 DIAGNOSIS — N390 Urinary tract infection, site not specified: Secondary | ICD-10-CM | POA: Diagnosis not present

## 2019-03-09 DIAGNOSIS — R1312 Dysphagia, oropharyngeal phase: Secondary | ICD-10-CM | POA: Diagnosis not present

## 2019-03-09 DIAGNOSIS — S065X9A Traumatic subdural hemorrhage with loss of consciousness of unspecified duration, initial encounter: Secondary | ICD-10-CM | POA: Diagnosis not present

## 2019-03-09 DIAGNOSIS — S72001A Fracture of unspecified part of neck of right femur, initial encounter for closed fracture: Secondary | ICD-10-CM | POA: Diagnosis not present

## 2019-03-09 DIAGNOSIS — R41841 Cognitive communication deficit: Secondary | ICD-10-CM | POA: Diagnosis not present

## 2019-03-09 DIAGNOSIS — W19XXXA Unspecified fall, initial encounter: Secondary | ICD-10-CM | POA: Diagnosis not present

## 2019-03-10 DIAGNOSIS — E871 Hypo-osmolality and hyponatremia: Secondary | ICD-10-CM | POA: Diagnosis not present

## 2019-03-10 DIAGNOSIS — S065X0A Traumatic subdural hemorrhage without loss of consciousness, initial encounter: Secondary | ICD-10-CM | POA: Diagnosis not present

## 2019-03-10 DIAGNOSIS — N390 Urinary tract infection, site not specified: Secondary | ICD-10-CM | POA: Diagnosis not present

## 2019-03-10 DIAGNOSIS — G8911 Acute pain due to trauma: Secondary | ICD-10-CM | POA: Diagnosis not present

## 2019-03-10 DIAGNOSIS — M9701XA Periprosthetic fracture around internal prosthetic right hip joint, initial encounter: Secondary | ICD-10-CM | POA: Diagnosis not present

## 2019-03-10 DIAGNOSIS — W19XXXA Unspecified fall, initial encounter: Secondary | ICD-10-CM | POA: Diagnosis not present

## 2019-03-11 DIAGNOSIS — S79921A Unspecified injury of right thigh, initial encounter: Secondary | ICD-10-CM | POA: Diagnosis not present

## 2019-03-11 DIAGNOSIS — Y33XXXA Other specified events, undetermined intent, initial encounter: Secondary | ICD-10-CM | POA: Diagnosis not present

## 2019-03-12 DIAGNOSIS — M810 Age-related osteoporosis without current pathological fracture: Secondary | ICD-10-CM | POA: Diagnosis not present

## 2019-03-12 DIAGNOSIS — M25551 Pain in right hip: Secondary | ICD-10-CM | POA: Diagnosis not present

## 2019-03-12 DIAGNOSIS — R1312 Dysphagia, oropharyngeal phase: Secondary | ICD-10-CM | POA: Diagnosis not present

## 2019-03-13 DIAGNOSIS — B965 Pseudomonas (aeruginosa) (mallei) (pseudomallei) as the cause of diseases classified elsewhere: Secondary | ICD-10-CM | POA: Diagnosis not present

## 2019-03-13 DIAGNOSIS — I6203 Nontraumatic chronic subdural hemorrhage: Secondary | ICD-10-CM | POA: Diagnosis not present

## 2019-03-13 DIAGNOSIS — E43 Unspecified severe protein-calorie malnutrition: Secondary | ICD-10-CM | POA: Diagnosis not present

## 2019-03-13 DIAGNOSIS — N390 Urinary tract infection, site not specified: Secondary | ICD-10-CM | POA: Diagnosis not present

## 2019-03-13 DIAGNOSIS — E871 Hypo-osmolality and hyponatremia: Secondary | ICD-10-CM | POA: Diagnosis not present

## 2019-03-13 DIAGNOSIS — W19XXXA Unspecified fall, initial encounter: Secondary | ICD-10-CM | POA: Diagnosis not present

## 2019-03-13 DIAGNOSIS — S065X0A Traumatic subdural hemorrhage without loss of consciousness, initial encounter: Secondary | ICD-10-CM | POA: Diagnosis not present

## 2019-03-13 DIAGNOSIS — G8911 Acute pain due to trauma: Secondary | ICD-10-CM | POA: Diagnosis not present

## 2019-03-13 DIAGNOSIS — M9701XA Periprosthetic fracture around internal prosthetic right hip joint, initial encounter: Secondary | ICD-10-CM | POA: Diagnosis not present

## 2019-03-14 DIAGNOSIS — M810 Age-related osteoporosis without current pathological fracture: Secondary | ICD-10-CM | POA: Diagnosis not present

## 2019-03-14 DIAGNOSIS — M9701XA Periprosthetic fracture around internal prosthetic right hip joint, initial encounter: Secondary | ICD-10-CM | POA: Diagnosis not present

## 2019-03-14 DIAGNOSIS — E871 Hypo-osmolality and hyponatremia: Secondary | ICD-10-CM | POA: Diagnosis not present

## 2019-03-14 DIAGNOSIS — I6203 Nontraumatic chronic subdural hemorrhage: Secondary | ICD-10-CM | POA: Diagnosis not present

## 2019-03-14 DIAGNOSIS — N390 Urinary tract infection, site not specified: Secondary | ICD-10-CM | POA: Diagnosis not present

## 2019-03-14 DIAGNOSIS — E43 Unspecified severe protein-calorie malnutrition: Secondary | ICD-10-CM | POA: Diagnosis not present

## 2019-03-14 DIAGNOSIS — G8911 Acute pain due to trauma: Secondary | ICD-10-CM | POA: Diagnosis not present

## 2019-03-14 DIAGNOSIS — S065X0A Traumatic subdural hemorrhage without loss of consciousness, initial encounter: Secondary | ICD-10-CM | POA: Diagnosis not present

## 2019-03-14 DIAGNOSIS — M9701XD Periprosthetic fracture around internal prosthetic right hip joint, subsequent encounter: Secondary | ICD-10-CM | POA: Diagnosis not present

## 2019-03-14 DIAGNOSIS — Z96641 Presence of right artificial hip joint: Secondary | ICD-10-CM | POA: Diagnosis not present

## 2019-03-14 DIAGNOSIS — W19XXXA Unspecified fall, initial encounter: Secondary | ICD-10-CM | POA: Diagnosis not present

## 2019-03-14 DIAGNOSIS — B965 Pseudomonas (aeruginosa) (mallei) (pseudomallei) as the cause of diseases classified elsewhere: Secondary | ICD-10-CM | POA: Diagnosis not present

## 2019-03-17 DIAGNOSIS — S065X9A Traumatic subdural hemorrhage with loss of consciousness of unspecified duration, initial encounter: Secondary | ICD-10-CM | POA: Diagnosis not present

## 2019-03-17 DIAGNOSIS — S72001A Fracture of unspecified part of neck of right femur, initial encounter for closed fracture: Secondary | ICD-10-CM | POA: Diagnosis not present

## 2019-03-17 DIAGNOSIS — W19XXXA Unspecified fall, initial encounter: Secondary | ICD-10-CM | POA: Diagnosis not present

## 2019-03-17 DIAGNOSIS — R1312 Dysphagia, oropharyngeal phase: Secondary | ICD-10-CM | POA: Diagnosis not present

## 2019-03-17 DIAGNOSIS — R41841 Cognitive communication deficit: Secondary | ICD-10-CM | POA: Diagnosis not present

## 2019-03-18 DIAGNOSIS — R41841 Cognitive communication deficit: Secondary | ICD-10-CM | POA: Diagnosis not present

## 2019-03-18 DIAGNOSIS — R1312 Dysphagia, oropharyngeal phase: Secondary | ICD-10-CM | POA: Diagnosis not present

## 2019-03-18 DIAGNOSIS — W19XXXA Unspecified fall, initial encounter: Secondary | ICD-10-CM | POA: Diagnosis not present

## 2019-03-18 DIAGNOSIS — S065X9A Traumatic subdural hemorrhage with loss of consciousness of unspecified duration, initial encounter: Secondary | ICD-10-CM | POA: Diagnosis not present

## 2019-03-18 DIAGNOSIS — S72001A Fracture of unspecified part of neck of right femur, initial encounter for closed fracture: Secondary | ICD-10-CM | POA: Diagnosis not present

## 2019-03-19 DIAGNOSIS — R41841 Cognitive communication deficit: Secondary | ICD-10-CM | POA: Diagnosis not present

## 2019-03-19 DIAGNOSIS — S72001A Fracture of unspecified part of neck of right femur, initial encounter for closed fracture: Secondary | ICD-10-CM | POA: Diagnosis not present

## 2019-03-19 DIAGNOSIS — S065X9A Traumatic subdural hemorrhage with loss of consciousness of unspecified duration, initial encounter: Secondary | ICD-10-CM | POA: Diagnosis not present

## 2019-03-19 DIAGNOSIS — W19XXXA Unspecified fall, initial encounter: Secondary | ICD-10-CM | POA: Diagnosis not present

## 2019-03-19 DIAGNOSIS — R1312 Dysphagia, oropharyngeal phase: Secondary | ICD-10-CM | POA: Diagnosis not present

## 2019-03-20 DIAGNOSIS — S72001A Fracture of unspecified part of neck of right femur, initial encounter for closed fracture: Secondary | ICD-10-CM | POA: Diagnosis not present

## 2019-03-20 DIAGNOSIS — R41841 Cognitive communication deficit: Secondary | ICD-10-CM | POA: Diagnosis not present

## 2019-03-20 DIAGNOSIS — R1312 Dysphagia, oropharyngeal phase: Secondary | ICD-10-CM | POA: Diagnosis not present

## 2019-03-20 DIAGNOSIS — W19XXXA Unspecified fall, initial encounter: Secondary | ICD-10-CM | POA: Diagnosis not present

## 2019-03-20 DIAGNOSIS — S065X9A Traumatic subdural hemorrhage with loss of consciousness of unspecified duration, initial encounter: Secondary | ICD-10-CM | POA: Diagnosis not present

## 2019-03-21 DIAGNOSIS — S065X9A Traumatic subdural hemorrhage with loss of consciousness of unspecified duration, initial encounter: Secondary | ICD-10-CM | POA: Diagnosis not present

## 2019-03-21 DIAGNOSIS — R41841 Cognitive communication deficit: Secondary | ICD-10-CM | POA: Diagnosis not present

## 2019-03-21 DIAGNOSIS — R1312 Dysphagia, oropharyngeal phase: Secondary | ICD-10-CM | POA: Diagnosis not present

## 2019-03-21 DIAGNOSIS — S72001A Fracture of unspecified part of neck of right femur, initial encounter for closed fracture: Secondary | ICD-10-CM | POA: Diagnosis not present

## 2019-03-21 DIAGNOSIS — W19XXXA Unspecified fall, initial encounter: Secondary | ICD-10-CM | POA: Diagnosis not present

## 2019-03-22 DIAGNOSIS — S72001A Fracture of unspecified part of neck of right femur, initial encounter for closed fracture: Secondary | ICD-10-CM | POA: Diagnosis not present

## 2019-03-22 DIAGNOSIS — R41841 Cognitive communication deficit: Secondary | ICD-10-CM | POA: Diagnosis not present

## 2019-03-22 DIAGNOSIS — R1312 Dysphagia, oropharyngeal phase: Secondary | ICD-10-CM | POA: Diagnosis not present

## 2019-03-22 DIAGNOSIS — S065X9A Traumatic subdural hemorrhage with loss of consciousness of unspecified duration, initial encounter: Secondary | ICD-10-CM | POA: Diagnosis not present

## 2019-03-22 DIAGNOSIS — W19XXXA Unspecified fall, initial encounter: Secondary | ICD-10-CM | POA: Diagnosis not present

## 2019-03-23 DIAGNOSIS — W19XXXA Unspecified fall, initial encounter: Secondary | ICD-10-CM | POA: Diagnosis not present

## 2019-03-23 DIAGNOSIS — S065X9A Traumatic subdural hemorrhage with loss of consciousness of unspecified duration, initial encounter: Secondary | ICD-10-CM | POA: Diagnosis not present

## 2019-03-23 DIAGNOSIS — R41841 Cognitive communication deficit: Secondary | ICD-10-CM | POA: Diagnosis not present

## 2019-03-23 DIAGNOSIS — S72001A Fracture of unspecified part of neck of right femur, initial encounter for closed fracture: Secondary | ICD-10-CM | POA: Diagnosis not present

## 2019-03-23 DIAGNOSIS — R1312 Dysphagia, oropharyngeal phase: Secondary | ICD-10-CM | POA: Diagnosis not present

## 2019-03-25 DIAGNOSIS — M9701XA Periprosthetic fracture around internal prosthetic right hip joint, initial encounter: Secondary | ICD-10-CM | POA: Diagnosis not present

## 2019-03-25 DIAGNOSIS — N179 Acute kidney failure, unspecified: Secondary | ICD-10-CM | POA: Diagnosis not present

## 2019-03-25 DIAGNOSIS — S065X0A Traumatic subdural hemorrhage without loss of consciousness, initial encounter: Secondary | ICD-10-CM | POA: Diagnosis not present

## 2019-03-25 DIAGNOSIS — M9702XA Periprosthetic fracture around internal prosthetic left hip joint, initial encounter: Secondary | ICD-10-CM | POA: Diagnosis not present

## 2019-03-25 DIAGNOSIS — W19XXXA Unspecified fall, initial encounter: Secondary | ICD-10-CM | POA: Diagnosis not present

## 2019-03-26 DIAGNOSIS — M9701XA Periprosthetic fracture around internal prosthetic right hip joint, initial encounter: Secondary | ICD-10-CM | POA: Diagnosis not present

## 2019-03-26 DIAGNOSIS — N179 Acute kidney failure, unspecified: Secondary | ICD-10-CM | POA: Diagnosis not present

## 2019-03-26 DIAGNOSIS — S065X0A Traumatic subdural hemorrhage without loss of consciousness, initial encounter: Secondary | ICD-10-CM | POA: Diagnosis not present

## 2019-03-26 DIAGNOSIS — M9702XA Periprosthetic fracture around internal prosthetic left hip joint, initial encounter: Secondary | ICD-10-CM | POA: Diagnosis not present

## 2019-03-26 DIAGNOSIS — W19XXXA Unspecified fall, initial encounter: Secondary | ICD-10-CM | POA: Diagnosis not present

## 2019-03-28 DIAGNOSIS — G8911 Acute pain due to trauma: Secondary | ICD-10-CM | POA: Diagnosis not present

## 2019-03-28 DIAGNOSIS — N390 Urinary tract infection, site not specified: Secondary | ICD-10-CM | POA: Diagnosis not present

## 2019-03-28 DIAGNOSIS — M9702XA Periprosthetic fracture around internal prosthetic left hip joint, initial encounter: Secondary | ICD-10-CM | POA: Diagnosis not present

## 2019-03-28 DIAGNOSIS — W19XXXA Unspecified fall, initial encounter: Secondary | ICD-10-CM | POA: Diagnosis not present

## 2019-03-28 DIAGNOSIS — S065X0A Traumatic subdural hemorrhage without loss of consciousness, initial encounter: Secondary | ICD-10-CM | POA: Diagnosis not present

## 2019-04-04 DIAGNOSIS — R918 Other nonspecific abnormal finding of lung field: Secondary | ICD-10-CM | POA: Diagnosis not present

## 2019-04-04 DIAGNOSIS — R41841 Cognitive communication deficit: Secondary | ICD-10-CM | POA: Diagnosis not present

## 2019-04-05 DIAGNOSIS — G8911 Acute pain due to trauma: Secondary | ICD-10-CM | POA: Diagnosis not present

## 2019-04-05 DIAGNOSIS — N179 Acute kidney failure, unspecified: Secondary | ICD-10-CM | POA: Diagnosis not present

## 2019-04-05 DIAGNOSIS — M9702XA Periprosthetic fracture around internal prosthetic left hip joint, initial encounter: Secondary | ICD-10-CM | POA: Diagnosis not present

## 2019-04-05 DIAGNOSIS — W19XXXA Unspecified fall, initial encounter: Secondary | ICD-10-CM | POA: Diagnosis not present

## 2019-04-05 DIAGNOSIS — S065X0A Traumatic subdural hemorrhage without loss of consciousness, initial encounter: Secondary | ICD-10-CM | POA: Diagnosis not present

## 2019-04-06 DIAGNOSIS — W19XXXA Unspecified fall, initial encounter: Secondary | ICD-10-CM | POA: Diagnosis not present

## 2019-04-06 DIAGNOSIS — S065X0A Traumatic subdural hemorrhage without loss of consciousness, initial encounter: Secondary | ICD-10-CM | POA: Diagnosis not present

## 2019-04-06 DIAGNOSIS — N179 Acute kidney failure, unspecified: Secondary | ICD-10-CM | POA: Diagnosis not present

## 2019-04-06 DIAGNOSIS — M9702XA Periprosthetic fracture around internal prosthetic left hip joint, initial encounter: Secondary | ICD-10-CM | POA: Diagnosis not present

## 2019-04-06 DIAGNOSIS — G8911 Acute pain due to trauma: Secondary | ICD-10-CM | POA: Diagnosis not present

## 2019-04-08 DIAGNOSIS — S065X9A Traumatic subdural hemorrhage with loss of consciousness of unspecified duration, initial encounter: Secondary | ICD-10-CM | POA: Diagnosis not present

## 2019-04-08 DIAGNOSIS — W19XXXA Unspecified fall, initial encounter: Secondary | ICD-10-CM | POA: Diagnosis not present

## 2019-04-08 DIAGNOSIS — S72031A Displaced midcervical fracture of right femur, initial encounter for closed fracture: Secondary | ICD-10-CM | POA: Diagnosis not present

## 2019-04-09 DIAGNOSIS — S72031A Displaced midcervical fracture of right femur, initial encounter for closed fracture: Secondary | ICD-10-CM | POA: Diagnosis not present

## 2019-04-09 DIAGNOSIS — W19XXXA Unspecified fall, initial encounter: Secondary | ICD-10-CM | POA: Diagnosis not present

## 2019-04-09 DIAGNOSIS — S065X9A Traumatic subdural hemorrhage with loss of consciousness of unspecified duration, initial encounter: Secondary | ICD-10-CM | POA: Diagnosis not present

## 2019-04-10 DIAGNOSIS — S065X9A Traumatic subdural hemorrhage with loss of consciousness of unspecified duration, initial encounter: Secondary | ICD-10-CM | POA: Diagnosis not present

## 2019-04-10 DIAGNOSIS — S72031A Displaced midcervical fracture of right femur, initial encounter for closed fracture: Secondary | ICD-10-CM | POA: Diagnosis not present

## 2019-04-10 DIAGNOSIS — W19XXXA Unspecified fall, initial encounter: Secondary | ICD-10-CM | POA: Diagnosis not present

## 2019-04-11 DIAGNOSIS — S72031A Displaced midcervical fracture of right femur, initial encounter for closed fracture: Secondary | ICD-10-CM | POA: Diagnosis not present

## 2019-04-11 DIAGNOSIS — S065X9A Traumatic subdural hemorrhage with loss of consciousness of unspecified duration, initial encounter: Secondary | ICD-10-CM | POA: Diagnosis not present

## 2019-04-11 DIAGNOSIS — W19XXXA Unspecified fall, initial encounter: Secondary | ICD-10-CM | POA: Diagnosis not present

## 2019-04-12 DIAGNOSIS — W19XXXA Unspecified fall, initial encounter: Secondary | ICD-10-CM | POA: Diagnosis not present

## 2019-04-12 DIAGNOSIS — S065X9A Traumatic subdural hemorrhage with loss of consciousness of unspecified duration, initial encounter: Secondary | ICD-10-CM | POA: Diagnosis not present

## 2019-04-12 DIAGNOSIS — S72031A Displaced midcervical fracture of right femur, initial encounter for closed fracture: Secondary | ICD-10-CM | POA: Diagnosis not present

## 2019-04-14 DIAGNOSIS — R41841 Cognitive communication deficit: Secondary | ICD-10-CM | POA: Diagnosis not present

## 2019-04-15 DIAGNOSIS — G8911 Acute pain due to trauma: Secondary | ICD-10-CM | POA: Diagnosis not present

## 2019-04-15 DIAGNOSIS — E878 Other disorders of electrolyte and fluid balance, not elsewhere classified: Secondary | ICD-10-CM | POA: Diagnosis not present

## 2019-04-15 DIAGNOSIS — R41841 Cognitive communication deficit: Secondary | ICD-10-CM | POA: Diagnosis not present

## 2019-04-15 DIAGNOSIS — E441 Mild protein-calorie malnutrition: Secondary | ICD-10-CM | POA: Diagnosis not present

## 2019-04-15 DIAGNOSIS — D62 Acute posthemorrhagic anemia: Secondary | ICD-10-CM | POA: Diagnosis not present

## 2019-04-15 DIAGNOSIS — Z299 Encounter for prophylactic measures, unspecified: Secondary | ICD-10-CM | POA: Diagnosis not present

## 2019-04-16 DIAGNOSIS — E441 Mild protein-calorie malnutrition: Secondary | ICD-10-CM | POA: Diagnosis not present

## 2019-04-16 DIAGNOSIS — E878 Other disorders of electrolyte and fluid balance, not elsewhere classified: Secondary | ICD-10-CM | POA: Diagnosis not present

## 2019-04-16 DIAGNOSIS — Z299 Encounter for prophylactic measures, unspecified: Secondary | ICD-10-CM | POA: Diagnosis not present

## 2019-04-16 DIAGNOSIS — G8911 Acute pain due to trauma: Secondary | ICD-10-CM | POA: Diagnosis not present

## 2019-04-16 DIAGNOSIS — R41841 Cognitive communication deficit: Secondary | ICD-10-CM | POA: Diagnosis not present

## 2019-04-16 DIAGNOSIS — D62 Acute posthemorrhagic anemia: Secondary | ICD-10-CM | POA: Diagnosis not present

## 2019-04-17 DIAGNOSIS — I6203 Nontraumatic chronic subdural hemorrhage: Secondary | ICD-10-CM | POA: Diagnosis not present

## 2019-04-17 DIAGNOSIS — R410 Disorientation, unspecified: Secondary | ICD-10-CM | POA: Diagnosis not present

## 2019-04-17 DIAGNOSIS — F068 Other specified mental disorders due to known physiological condition: Secondary | ICD-10-CM | POA: Diagnosis not present

## 2019-04-17 DIAGNOSIS — W19XXXA Unspecified fall, initial encounter: Secondary | ICD-10-CM | POA: Diagnosis not present

## 2019-04-17 DIAGNOSIS — M6281 Muscle weakness (generalized): Secondary | ICD-10-CM | POA: Diagnosis not present

## 2019-04-17 DIAGNOSIS — Z9181 History of falling: Secondary | ICD-10-CM | POA: Diagnosis not present

## 2019-04-17 DIAGNOSIS — Z7401 Bed confinement status: Secondary | ICD-10-CM | POA: Diagnosis not present

## 2019-04-18 DIAGNOSIS — K5901 Slow transit constipation: Secondary | ICD-10-CM | POA: Diagnosis not present

## 2019-04-18 DIAGNOSIS — N400 Enlarged prostate without lower urinary tract symptoms: Secondary | ICD-10-CM | POA: Diagnosis not present

## 2019-04-18 DIAGNOSIS — I63039 Cerebral infarction due to thrombosis of unspecified carotid artery: Secondary | ICD-10-CM | POA: Diagnosis not present

## 2019-04-18 DIAGNOSIS — S72001A Fracture of unspecified part of neck of right femur, initial encounter for closed fracture: Secondary | ICD-10-CM | POA: Diagnosis not present

## 2019-04-21 DIAGNOSIS — I10 Essential (primary) hypertension: Secondary | ICD-10-CM | POA: Diagnosis not present

## 2019-04-21 DIAGNOSIS — R296 Repeated falls: Secondary | ICD-10-CM | POA: Diagnosis not present

## 2019-04-21 DIAGNOSIS — Z96641 Presence of right artificial hip joint: Secondary | ICD-10-CM | POA: Diagnosis not present

## 2019-04-21 DIAGNOSIS — G4700 Insomnia, unspecified: Secondary | ICD-10-CM | POA: Diagnosis not present

## 2019-04-21 DIAGNOSIS — M25551 Pain in right hip: Secondary | ICD-10-CM | POA: Diagnosis not present

## 2019-04-21 DIAGNOSIS — M79651 Pain in right thigh: Secondary | ICD-10-CM | POA: Diagnosis not present

## 2019-04-29 DIAGNOSIS — M47816 Spondylosis without myelopathy or radiculopathy, lumbar region: Secondary | ICD-10-CM | POA: Diagnosis not present

## 2019-04-29 DIAGNOSIS — G4700 Insomnia, unspecified: Secondary | ICD-10-CM | POA: Diagnosis not present

## 2019-04-29 DIAGNOSIS — M47814 Spondylosis without myelopathy or radiculopathy, thoracic region: Secondary | ICD-10-CM | POA: Diagnosis not present

## 2019-04-29 DIAGNOSIS — M8008XA Age-related osteoporosis with current pathological fracture, vertebra(e), initial encounter for fracture: Secondary | ICD-10-CM | POA: Diagnosis not present

## 2019-04-29 DIAGNOSIS — R296 Repeated falls: Secondary | ICD-10-CM | POA: Diagnosis not present

## 2019-04-29 DIAGNOSIS — I10 Essential (primary) hypertension: Secondary | ICD-10-CM | POA: Diagnosis not present

## 2019-04-29 DIAGNOSIS — M419 Scoliosis, unspecified: Secondary | ICD-10-CM | POA: Diagnosis not present

## 2019-04-29 DIAGNOSIS — G501 Atypical facial pain: Secondary | ICD-10-CM | POA: Diagnosis not present

## 2019-04-30 DIAGNOSIS — Z515 Encounter for palliative care: Secondary | ICD-10-CM | POA: Diagnosis not present

## 2019-04-30 DIAGNOSIS — G4700 Insomnia, unspecified: Secondary | ICD-10-CM | POA: Diagnosis not present

## 2019-04-30 DIAGNOSIS — Z66 Do not resuscitate: Secondary | ICD-10-CM | POA: Diagnosis not present

## 2019-04-30 DIAGNOSIS — S72001A Fracture of unspecified part of neck of right femur, initial encounter for closed fracture: Secondary | ICD-10-CM | POA: Diagnosis not present

## 2019-05-02 DIAGNOSIS — S72001A Fracture of unspecified part of neck of right femur, initial encounter for closed fracture: Secondary | ICD-10-CM | POA: Diagnosis not present

## 2019-05-02 DIAGNOSIS — I63039 Cerebral infarction due to thrombosis of unspecified carotid artery: Secondary | ICD-10-CM | POA: Diagnosis not present

## 2019-05-12 DIAGNOSIS — K047 Periapical abscess without sinus: Secondary | ICD-10-CM | POA: Diagnosis not present

## 2019-05-12 DIAGNOSIS — I10 Essential (primary) hypertension: Secondary | ICD-10-CM | POA: Diagnosis not present

## 2019-05-12 DIAGNOSIS — N400 Enlarged prostate without lower urinary tract symptoms: Secondary | ICD-10-CM | POA: Diagnosis not present

## 2019-05-14 DIAGNOSIS — K047 Periapical abscess without sinus: Secondary | ICD-10-CM | POA: Diagnosis not present

## 2019-05-14 DIAGNOSIS — I10 Essential (primary) hypertension: Secondary | ICD-10-CM | POA: Diagnosis not present

## 2019-05-14 DIAGNOSIS — N400 Enlarged prostate without lower urinary tract symptoms: Secondary | ICD-10-CM | POA: Diagnosis not present

## 2019-05-30 DIAGNOSIS — M25551 Pain in right hip: Secondary | ICD-10-CM | POA: Diagnosis not present

## 2019-05-30 DIAGNOSIS — R296 Repeated falls: Secondary | ICD-10-CM | POA: Diagnosis not present

## 2019-05-30 DIAGNOSIS — K047 Periapical abscess without sinus: Secondary | ICD-10-CM | POA: Diagnosis not present

## 2019-05-30 DIAGNOSIS — S72001A Fracture of unspecified part of neck of right femur, initial encounter for closed fracture: Secondary | ICD-10-CM | POA: Diagnosis not present

## 2019-06-11 DIAGNOSIS — S72001A Fracture of unspecified part of neck of right femur, initial encounter for closed fracture: Secondary | ICD-10-CM | POA: Diagnosis not present

## 2019-06-11 DIAGNOSIS — M25551 Pain in right hip: Secondary | ICD-10-CM | POA: Diagnosis not present

## 2019-06-25 DIAGNOSIS — I63039 Cerebral infarction due to thrombosis of unspecified carotid artery: Secondary | ICD-10-CM | POA: Diagnosis not present

## 2019-06-25 DIAGNOSIS — M25551 Pain in right hip: Secondary | ICD-10-CM | POA: Diagnosis not present

## 2019-07-01 DIAGNOSIS — Z8679 Personal history of other diseases of the circulatory system: Secondary | ICD-10-CM | POA: Diagnosis not present

## 2019-07-01 DIAGNOSIS — R41841 Cognitive communication deficit: Secondary | ICD-10-CM | POA: Diagnosis not present

## 2019-07-01 DIAGNOSIS — R262 Difficulty in walking, not elsewhere classified: Secondary | ICD-10-CM | POA: Diagnosis not present

## 2019-07-01 DIAGNOSIS — W1839XA Other fall on same level, initial encounter: Secondary | ICD-10-CM | POA: Diagnosis not present

## 2019-07-01 DIAGNOSIS — M9701XD Periprosthetic fracture around internal prosthetic right hip joint, subsequent encounter: Secondary | ICD-10-CM | POA: Diagnosis not present

## 2019-07-01 DIAGNOSIS — M1991 Primary osteoarthritis, unspecified site: Secondary | ICD-10-CM | POA: Diagnosis not present

## 2019-07-01 DIAGNOSIS — M6282 Rhabdomyolysis: Secondary | ICD-10-CM | POA: Diagnosis not present

## 2019-07-01 DIAGNOSIS — Z9181 History of falling: Secondary | ICD-10-CM | POA: Diagnosis not present

## 2019-07-01 DIAGNOSIS — S72001A Fracture of unspecified part of neck of right femur, initial encounter for closed fracture: Secondary | ICD-10-CM | POA: Diagnosis not present

## 2019-07-01 DIAGNOSIS — M80051D Age-related osteoporosis with current pathological fracture, right femur, subsequent encounter for fracture with routine healing: Secondary | ICD-10-CM | POA: Diagnosis not present

## 2019-07-01 DIAGNOSIS — Z9889 Other specified postprocedural states: Secondary | ICD-10-CM | POA: Diagnosis not present

## 2019-07-01 DIAGNOSIS — M545 Low back pain: Secondary | ICD-10-CM | POA: Diagnosis not present

## 2019-07-01 DIAGNOSIS — M6281 Muscle weakness (generalized): Secondary | ICD-10-CM | POA: Diagnosis not present

## 2019-07-03 DIAGNOSIS — W1839XA Other fall on same level, initial encounter: Secondary | ICD-10-CM | POA: Diagnosis not present

## 2019-07-03 DIAGNOSIS — S72001A Fracture of unspecified part of neck of right femur, initial encounter for closed fracture: Secondary | ICD-10-CM | POA: Diagnosis not present

## 2019-07-03 DIAGNOSIS — Z9889 Other specified postprocedural states: Secondary | ICD-10-CM | POA: Diagnosis not present

## 2019-07-04 DIAGNOSIS — S72001A Fracture of unspecified part of neck of right femur, initial encounter for closed fracture: Secondary | ICD-10-CM | POA: Diagnosis not present

## 2019-07-07 DIAGNOSIS — G4700 Insomnia, unspecified: Secondary | ICD-10-CM | POA: Diagnosis not present

## 2019-07-08 DIAGNOSIS — Z20828 Contact with and (suspected) exposure to other viral communicable diseases: Secondary | ICD-10-CM | POA: Diagnosis not present

## 2019-07-16 DIAGNOSIS — Z20828 Contact with and (suspected) exposure to other viral communicable diseases: Secondary | ICD-10-CM | POA: Diagnosis not present

## 2019-07-18 DIAGNOSIS — K047 Periapical abscess without sinus: Secondary | ICD-10-CM | POA: Diagnosis not present

## 2019-07-18 DIAGNOSIS — I10 Essential (primary) hypertension: Secondary | ICD-10-CM | POA: Diagnosis not present

## 2019-07-18 DIAGNOSIS — G4700 Insomnia, unspecified: Secondary | ICD-10-CM | POA: Diagnosis not present

## 2019-07-21 DIAGNOSIS — G4700 Insomnia, unspecified: Secondary | ICD-10-CM | POA: Diagnosis not present

## 2019-07-21 DIAGNOSIS — I10 Essential (primary) hypertension: Secondary | ICD-10-CM | POA: Diagnosis not present

## 2019-07-21 DIAGNOSIS — K047 Periapical abscess without sinus: Secondary | ICD-10-CM | POA: Diagnosis not present

## 2019-07-22 DIAGNOSIS — E538 Deficiency of other specified B group vitamins: Secondary | ICD-10-CM | POA: Diagnosis not present

## 2019-07-22 DIAGNOSIS — Z20828 Contact with and (suspected) exposure to other viral communicable diseases: Secondary | ICD-10-CM | POA: Diagnosis not present

## 2019-07-22 DIAGNOSIS — G4700 Insomnia, unspecified: Secondary | ICD-10-CM | POA: Diagnosis not present

## 2019-07-28 DIAGNOSIS — M25551 Pain in right hip: Secondary | ICD-10-CM | POA: Diagnosis not present

## 2019-07-28 DIAGNOSIS — Z515 Encounter for palliative care: Secondary | ICD-10-CM | POA: Diagnosis not present

## 2019-07-29 DIAGNOSIS — Z20828 Contact with and (suspected) exposure to other viral communicable diseases: Secondary | ICD-10-CM | POA: Diagnosis not present

## 2019-08-05 DIAGNOSIS — E538 Deficiency of other specified B group vitamins: Secondary | ICD-10-CM | POA: Diagnosis not present

## 2019-08-05 DIAGNOSIS — G4700 Insomnia, unspecified: Secondary | ICD-10-CM | POA: Diagnosis not present

## 2019-08-05 DIAGNOSIS — F259 Schizoaffective disorder, unspecified: Secondary | ICD-10-CM | POA: Diagnosis not present

## 2019-08-06 DIAGNOSIS — Z20828 Contact with and (suspected) exposure to other viral communicable diseases: Secondary | ICD-10-CM | POA: Diagnosis not present

## 2019-08-13 DIAGNOSIS — Z20828 Contact with and (suspected) exposure to other viral communicable diseases: Secondary | ICD-10-CM | POA: Diagnosis not present

## 2019-08-20 DIAGNOSIS — Z20828 Contact with and (suspected) exposure to other viral communicable diseases: Secondary | ICD-10-CM | POA: Diagnosis not present

## 2019-08-25 DIAGNOSIS — M25551 Pain in right hip: Secondary | ICD-10-CM | POA: Diagnosis not present

## 2019-08-25 DIAGNOSIS — S72001A Fracture of unspecified part of neck of right femur, initial encounter for closed fracture: Secondary | ICD-10-CM | POA: Diagnosis not present

## 2019-08-25 DIAGNOSIS — Z515 Encounter for palliative care: Secondary | ICD-10-CM | POA: Diagnosis not present

## 2019-08-25 DIAGNOSIS — I63039 Cerebral infarction due to thrombosis of unspecified carotid artery: Secondary | ICD-10-CM | POA: Diagnosis not present

## 2019-08-29 DIAGNOSIS — Z20828 Contact with and (suspected) exposure to other viral communicable diseases: Secondary | ICD-10-CM | POA: Diagnosis not present

## 2019-09-05 DIAGNOSIS — Z20828 Contact with and (suspected) exposure to other viral communicable diseases: Secondary | ICD-10-CM | POA: Diagnosis not present

## 2019-09-11 DIAGNOSIS — Z20828 Contact with and (suspected) exposure to other viral communicable diseases: Secondary | ICD-10-CM | POA: Diagnosis not present

## 2019-09-16 DIAGNOSIS — G4700 Insomnia, unspecified: Secondary | ICD-10-CM | POA: Diagnosis not present

## 2019-09-16 DIAGNOSIS — E569 Vitamin deficiency, unspecified: Secondary | ICD-10-CM | POA: Diagnosis not present

## 2019-09-16 DIAGNOSIS — F0390 Unspecified dementia without behavioral disturbance: Secondary | ICD-10-CM | POA: Diagnosis not present

## 2019-09-19 DIAGNOSIS — K5901 Slow transit constipation: Secondary | ICD-10-CM | POA: Diagnosis not present

## 2019-09-19 DIAGNOSIS — M25551 Pain in right hip: Secondary | ICD-10-CM | POA: Diagnosis not present

## 2019-09-19 DIAGNOSIS — Z515 Encounter for palliative care: Secondary | ICD-10-CM | POA: Diagnosis not present

## 2019-09-19 DIAGNOSIS — Z20820 Contact with and (suspected) exposure to varicella: Secondary | ICD-10-CM | POA: Diagnosis not present

## 2019-09-19 DIAGNOSIS — N400 Enlarged prostate without lower urinary tract symptoms: Secondary | ICD-10-CM | POA: Diagnosis not present

## 2019-09-20 DIAGNOSIS — M25551 Pain in right hip: Secondary | ICD-10-CM | POA: Diagnosis not present

## 2019-09-20 DIAGNOSIS — N400 Enlarged prostate without lower urinary tract symptoms: Secondary | ICD-10-CM | POA: Diagnosis not present

## 2019-09-20 DIAGNOSIS — U071 COVID-19: Secondary | ICD-10-CM | POA: Diagnosis not present

## 2019-09-20 DIAGNOSIS — Z515 Encounter for palliative care: Secondary | ICD-10-CM | POA: Diagnosis not present

## 2019-09-21 DIAGNOSIS — U071 COVID-19: Secondary | ICD-10-CM | POA: Diagnosis not present

## 2019-09-21 DIAGNOSIS — M25551 Pain in right hip: Secondary | ICD-10-CM | POA: Diagnosis not present

## 2019-09-21 DIAGNOSIS — S61217A Laceration without foreign body of left little finger without damage to nail, initial encounter: Secondary | ICD-10-CM | POA: Diagnosis not present

## 2019-09-21 DIAGNOSIS — Z515 Encounter for palliative care: Secondary | ICD-10-CM | POA: Diagnosis not present

## 2019-09-22 DIAGNOSIS — U071 COVID-19: Secondary | ICD-10-CM | POA: Diagnosis not present

## 2019-09-22 DIAGNOSIS — S61209A Unspecified open wound of unspecified finger without damage to nail, initial encounter: Secondary | ICD-10-CM | POA: Diagnosis not present

## 2019-09-22 DIAGNOSIS — N400 Enlarged prostate without lower urinary tract symptoms: Secondary | ICD-10-CM | POA: Diagnosis not present

## 2019-09-22 DIAGNOSIS — R5381 Other malaise: Secondary | ICD-10-CM | POA: Diagnosis not present

## 2019-09-22 DIAGNOSIS — W2209XA Striking against other stationary object, initial encounter: Secondary | ICD-10-CM | POA: Diagnosis not present

## 2019-09-22 DIAGNOSIS — F259 Schizoaffective disorder, unspecified: Secondary | ICD-10-CM | POA: Diagnosis not present

## 2019-09-22 DIAGNOSIS — F068 Other specified mental disorders due to known physiological condition: Secondary | ICD-10-CM | POA: Diagnosis not present

## 2019-09-22 DIAGNOSIS — F0390 Unspecified dementia without behavioral disturbance: Secondary | ICD-10-CM | POA: Diagnosis not present

## 2019-09-22 DIAGNOSIS — R4689 Other symptoms and signs involving appearance and behavior: Secondary | ICD-10-CM | POA: Diagnosis not present

## 2019-09-22 DIAGNOSIS — Y929 Unspecified place or not applicable: Secondary | ICD-10-CM | POA: Diagnosis not present

## 2019-09-22 DIAGNOSIS — M79642 Pain in left hand: Secondary | ICD-10-CM | POA: Diagnosis not present

## 2019-09-22 DIAGNOSIS — S61217A Laceration without foreign body of left little finger without damage to nail, initial encounter: Secondary | ICD-10-CM | POA: Diagnosis not present

## 2019-09-24 DIAGNOSIS — S61217A Laceration without foreign body of left little finger without damage to nail, initial encounter: Secondary | ICD-10-CM | POA: Diagnosis not present

## 2019-09-24 DIAGNOSIS — F0390 Unspecified dementia without behavioral disturbance: Secondary | ICD-10-CM | POA: Diagnosis not present

## 2019-09-25 DIAGNOSIS — R05 Cough: Secondary | ICD-10-CM | POA: Diagnosis not present

## 2019-09-25 DIAGNOSIS — M25551 Pain in right hip: Secondary | ICD-10-CM | POA: Diagnosis not present

## 2019-09-25 DIAGNOSIS — U071 COVID-19: Secondary | ICD-10-CM | POA: Diagnosis not present

## 2019-09-25 DIAGNOSIS — N400 Enlarged prostate without lower urinary tract symptoms: Secondary | ICD-10-CM | POA: Diagnosis not present

## 2019-09-26 DIAGNOSIS — I10 Essential (primary) hypertension: Secondary | ICD-10-CM | POA: Diagnosis not present

## 2019-09-26 DIAGNOSIS — U071 COVID-19: Secondary | ICD-10-CM | POA: Diagnosis not present

## 2019-09-26 DIAGNOSIS — N400 Enlarged prostate without lower urinary tract symptoms: Secondary | ICD-10-CM | POA: Diagnosis not present

## 2019-09-29 DIAGNOSIS — U071 COVID-19: Secondary | ICD-10-CM | POA: Diagnosis not present

## 2019-09-29 DIAGNOSIS — K5901 Slow transit constipation: Secondary | ICD-10-CM | POA: Diagnosis not present

## 2019-09-29 DIAGNOSIS — F0390 Unspecified dementia without behavioral disturbance: Secondary | ICD-10-CM | POA: Diagnosis not present

## 2019-10-06 DIAGNOSIS — N400 Enlarged prostate without lower urinary tract symptoms: Secondary | ICD-10-CM | POA: Diagnosis not present

## 2019-10-06 DIAGNOSIS — F0390 Unspecified dementia without behavioral disturbance: Secondary | ICD-10-CM | POA: Diagnosis not present

## 2019-10-06 DIAGNOSIS — U071 COVID-19: Secondary | ICD-10-CM | POA: Diagnosis not present

## 2019-10-10 DIAGNOSIS — S61217A Laceration without foreign body of left little finger without damage to nail, initial encounter: Secondary | ICD-10-CM | POA: Diagnosis not present

## 2019-10-10 DIAGNOSIS — F0390 Unspecified dementia without behavioral disturbance: Secondary | ICD-10-CM | POA: Diagnosis not present

## 2019-10-28 DIAGNOSIS — F259 Schizoaffective disorder, unspecified: Secondary | ICD-10-CM | POA: Diagnosis not present

## 2019-10-28 DIAGNOSIS — G4700 Insomnia, unspecified: Secondary | ICD-10-CM | POA: Diagnosis not present

## 2019-10-28 DIAGNOSIS — E538 Deficiency of other specified B group vitamins: Secondary | ICD-10-CM | POA: Diagnosis not present

## 2019-11-22 IMAGING — CT CT HEAD WITHOUT CONTRAST
3 series · 14 of 46 positions shown, 16 images · non-contrast
Comparison: Earlier same day

CLINICAL DATA: Generalized weakness and possible fall.

EXAM:
CT HEAD WITHOUT CONTRAST
TECHNIQUE: Contiguous axial images were obtained from the base of the skull
through the vertex without intravenous contrast.

[Series 2: head wo · axial · 0.42mm/px · z∈[+368,+488]mm · 8 of 29 slices shown, 10 images]
[im 3/29  brain]
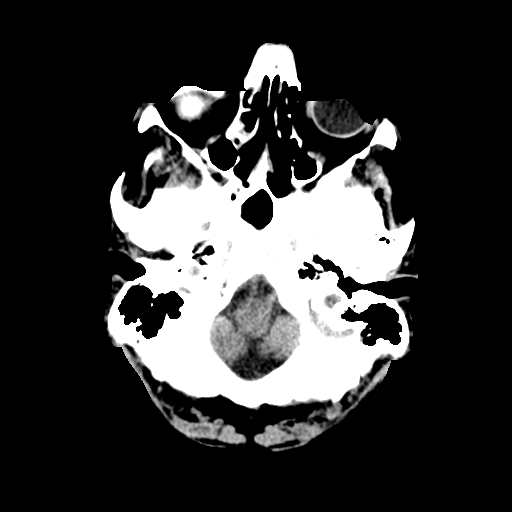
[im 3/29  bone]
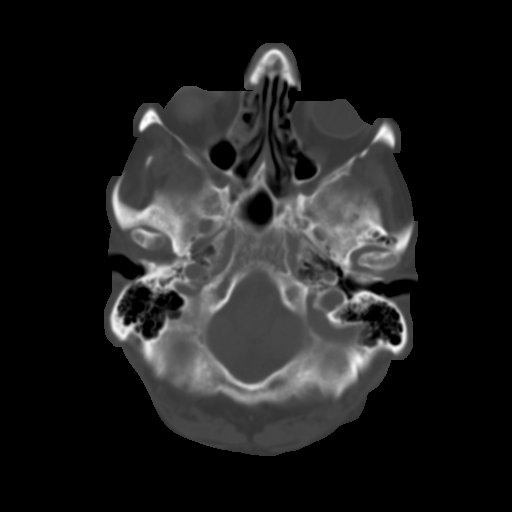
[im 7/29  brain]
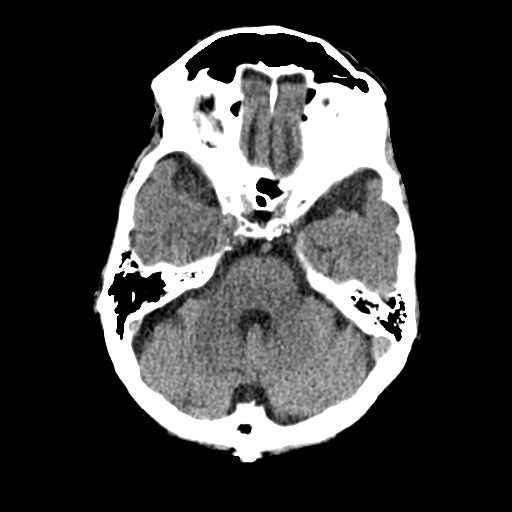
[im 10/29  brain]
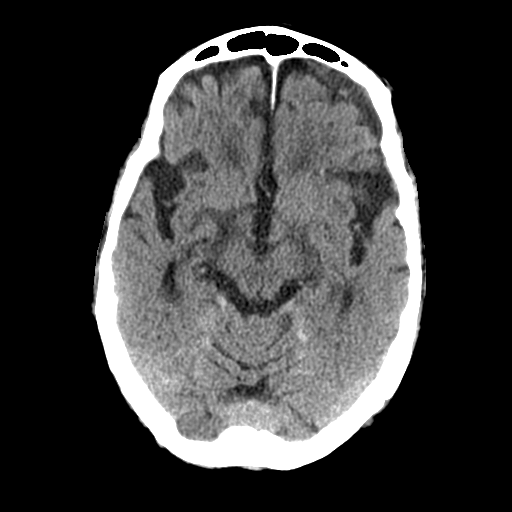
[im 13/29  brain]
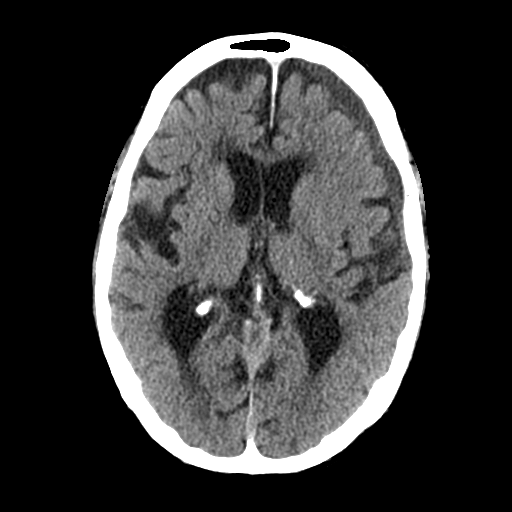
[im 17/29  brain]
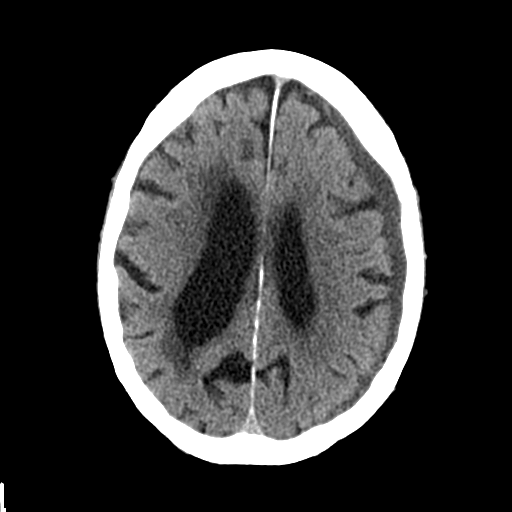
[im 17/29  bone]
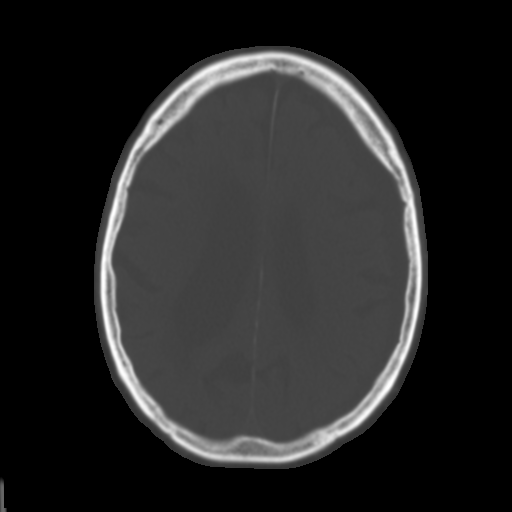
[im 20/29  brain]
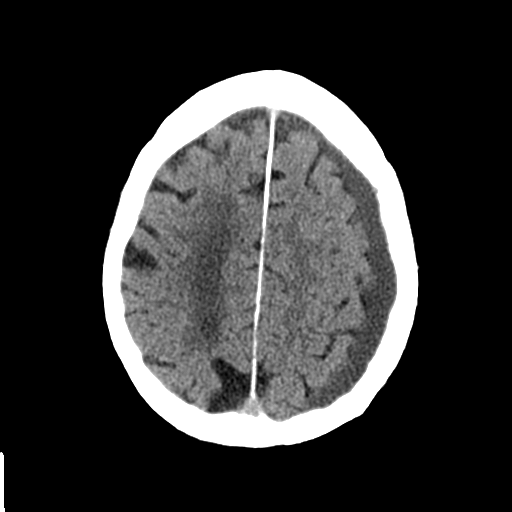
[im 23/29  brain]
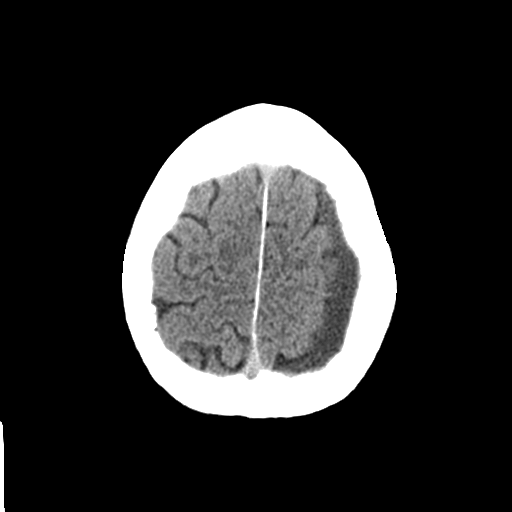
[im 27/29  brain]
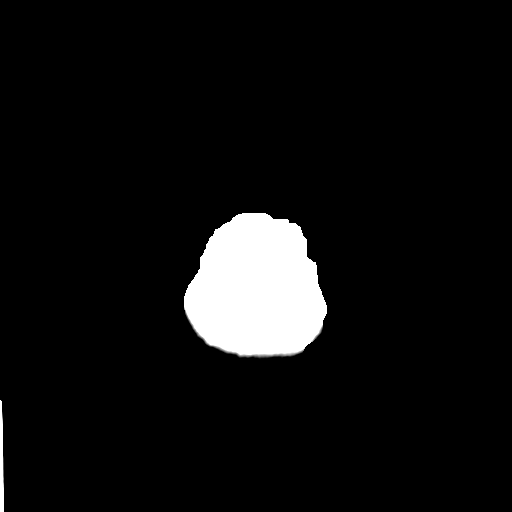

[Series 4: coronal soft tissue · coronal · 0.27mm/px · 3 of 64 slices shown]
[im 22/64  brain]
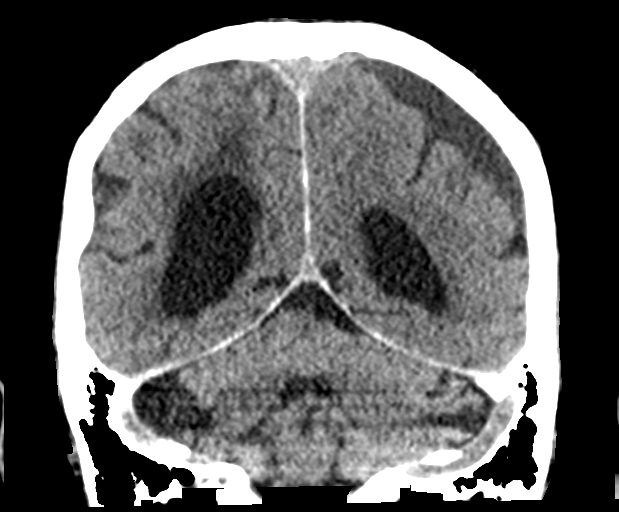
[im 29/64  brain]
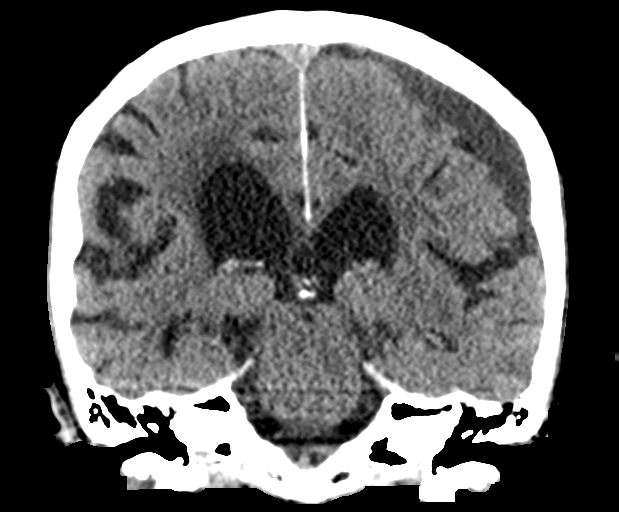
[im 36/64  brain]
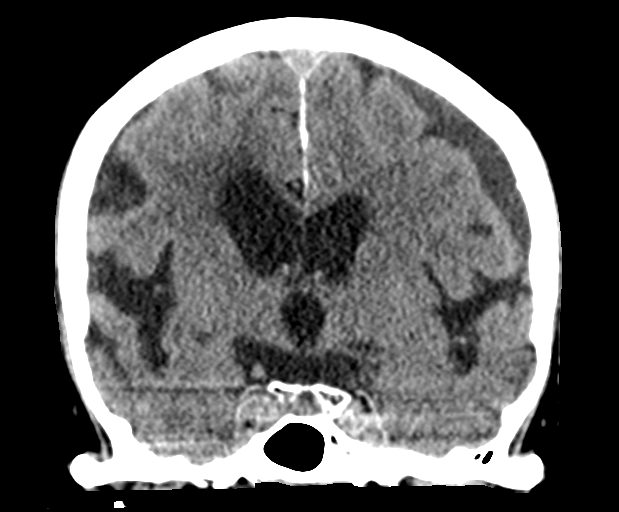

[Series 5: sagittal soft tissue · sagittal · 0.29mm/px · 3 of 49 slices shown]
[im 17/49  brain]
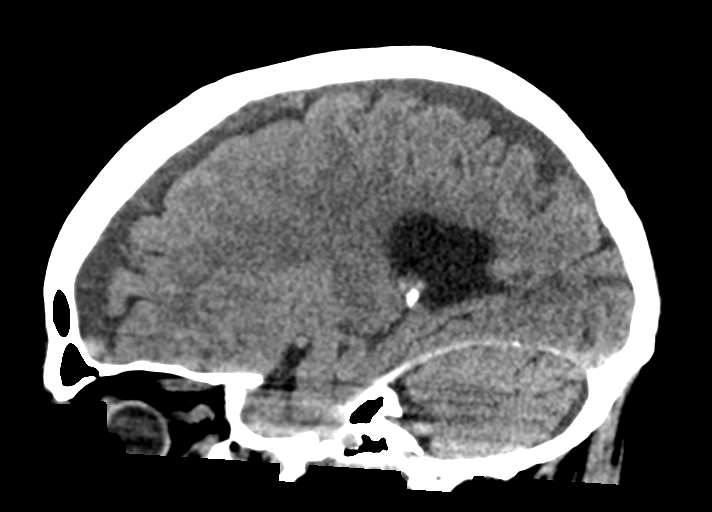
[im 25/49  brain]
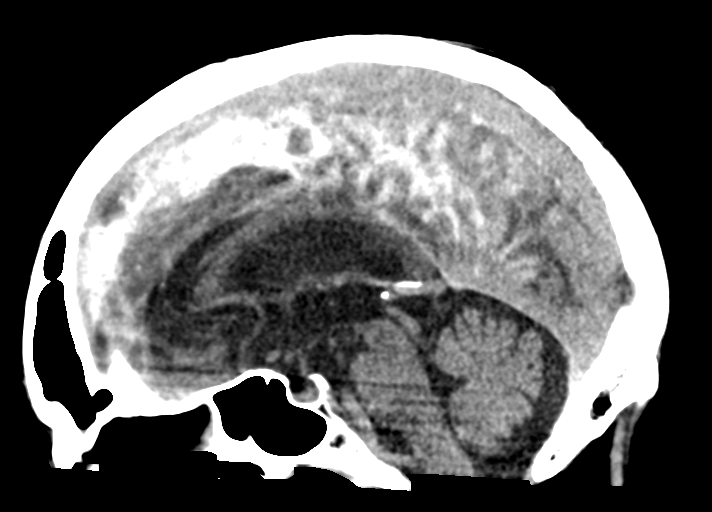
[im 33/49  brain]
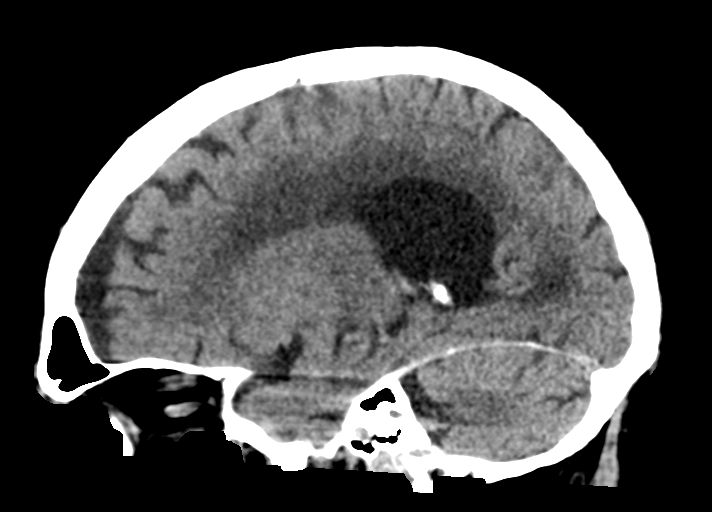

[14 of 46 positions shown; findings below may reference images not displayed]

FINDINGS: Brain: No change since earlier today. Left-sided low-density
subdural fluid collection measuring 9 mm in thickness. Small amount
of hyperdense blood in the inferior frontal region of this. No
evidence that this has increased since the study of earlier today.
Generalized brain atrophy. Chronic small-vessel ischemic changes of
the white matter. Left-to-right midline shift of 2 mm. No sign of
acute infarction, mass lesion or hydrocephalus.

Vascular: There is atherosclerotic calcification of the major
vessels at the base of the brain.

Skull: Negative

Sinuses/Orbits: Mild mucosal inflammatory changes of the sinuses.
Old medial orbital blowout fracture on the left. Globe prosthesis on
the right.

Other: None
IMPRESSION: No change since earlier today. Low-density subdural collection on
the left measuring 9 mm in thickness. Small amount of hyperdense
blood in the frontal region, not increased from earlier. Minimal
mass effect with left-to-right shift of 2 mm, unchanged.

## 2019-12-03 DIAGNOSIS — Z79899 Other long term (current) drug therapy: Secondary | ICD-10-CM | POA: Diagnosis not present

## 2019-12-03 DIAGNOSIS — D649 Anemia, unspecified: Secondary | ICD-10-CM | POA: Diagnosis not present

## 2019-12-16 DIAGNOSIS — F259 Schizoaffective disorder, unspecified: Secondary | ICD-10-CM | POA: Diagnosis not present

## 2019-12-16 DIAGNOSIS — G4700 Insomnia, unspecified: Secondary | ICD-10-CM | POA: Diagnosis not present

## 2019-12-29 IMAGING — CR DG HIP (WITH OR WITHOUT PELVIS) 2-3V RIGHT
4 series · 4 of 4 positions shown · non-contrast
Comparison: 01/16/2019

CLINICAL DATA: Fall at home with shortening and rotation of the
right leg and hip

EXAM:
DG HIP (WITH OR WITHOUT PELVIS) 2-3V RIGHT

[pelvis ap]
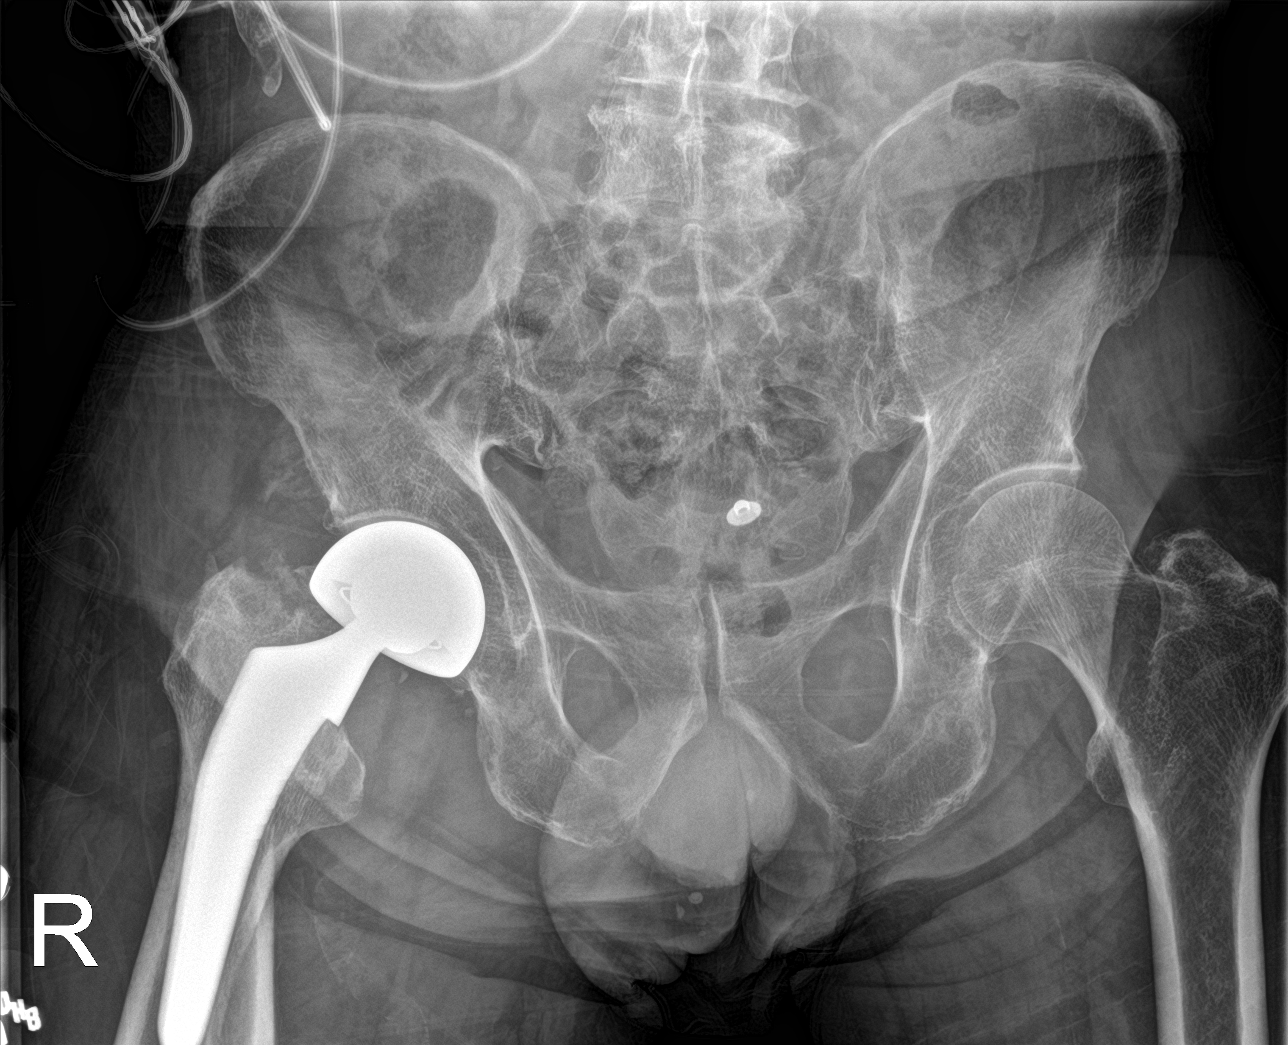

[hip ap]
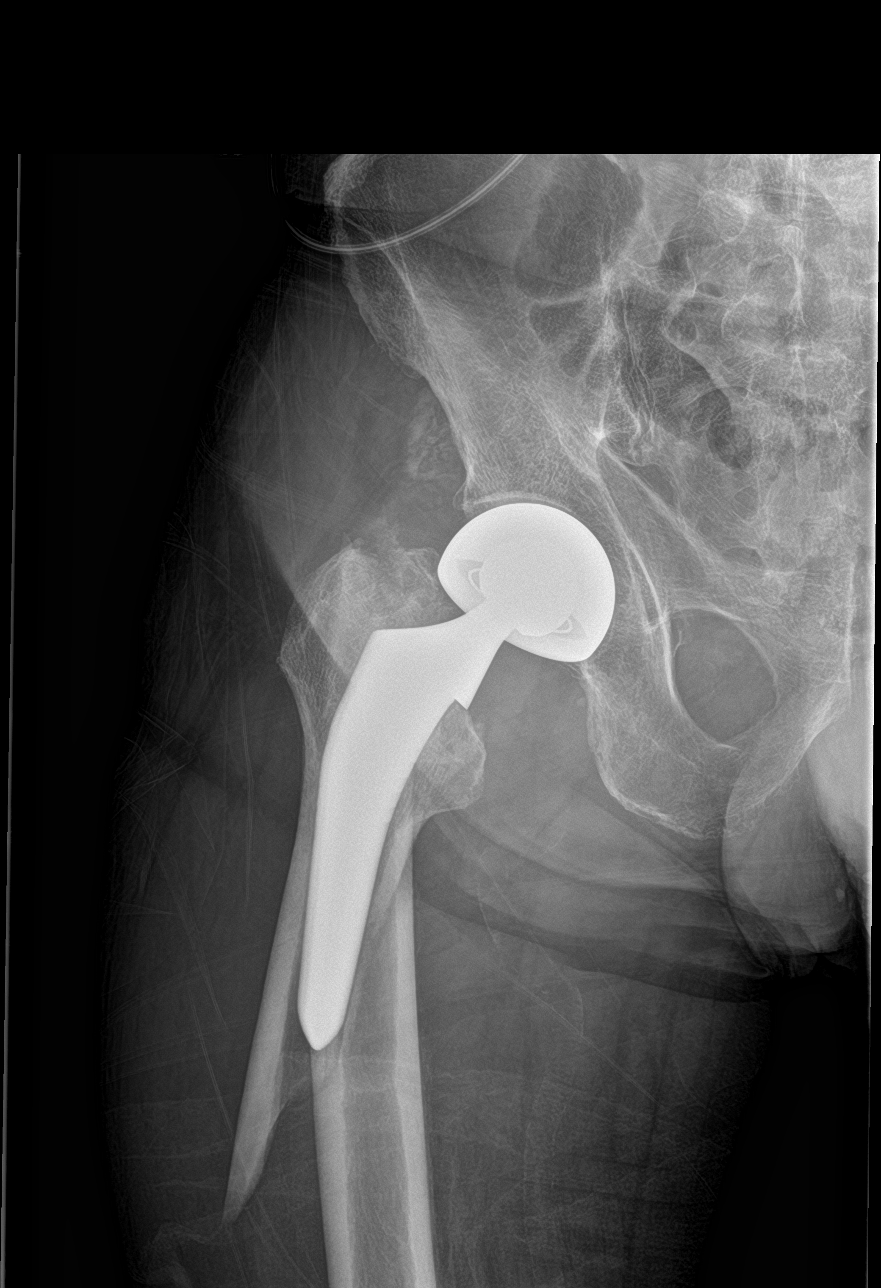

[hip lat (1 of 2)]
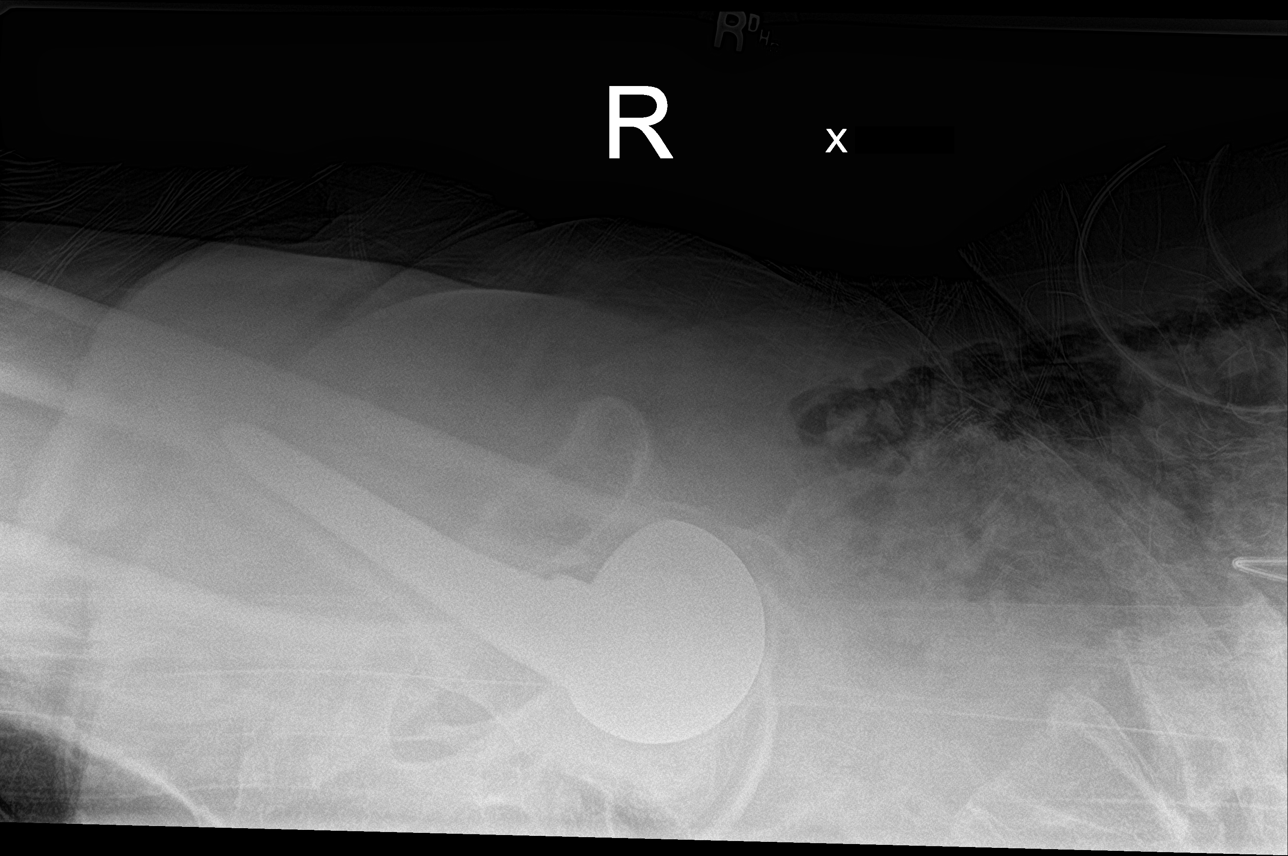

[hip lat (2 of 2)]
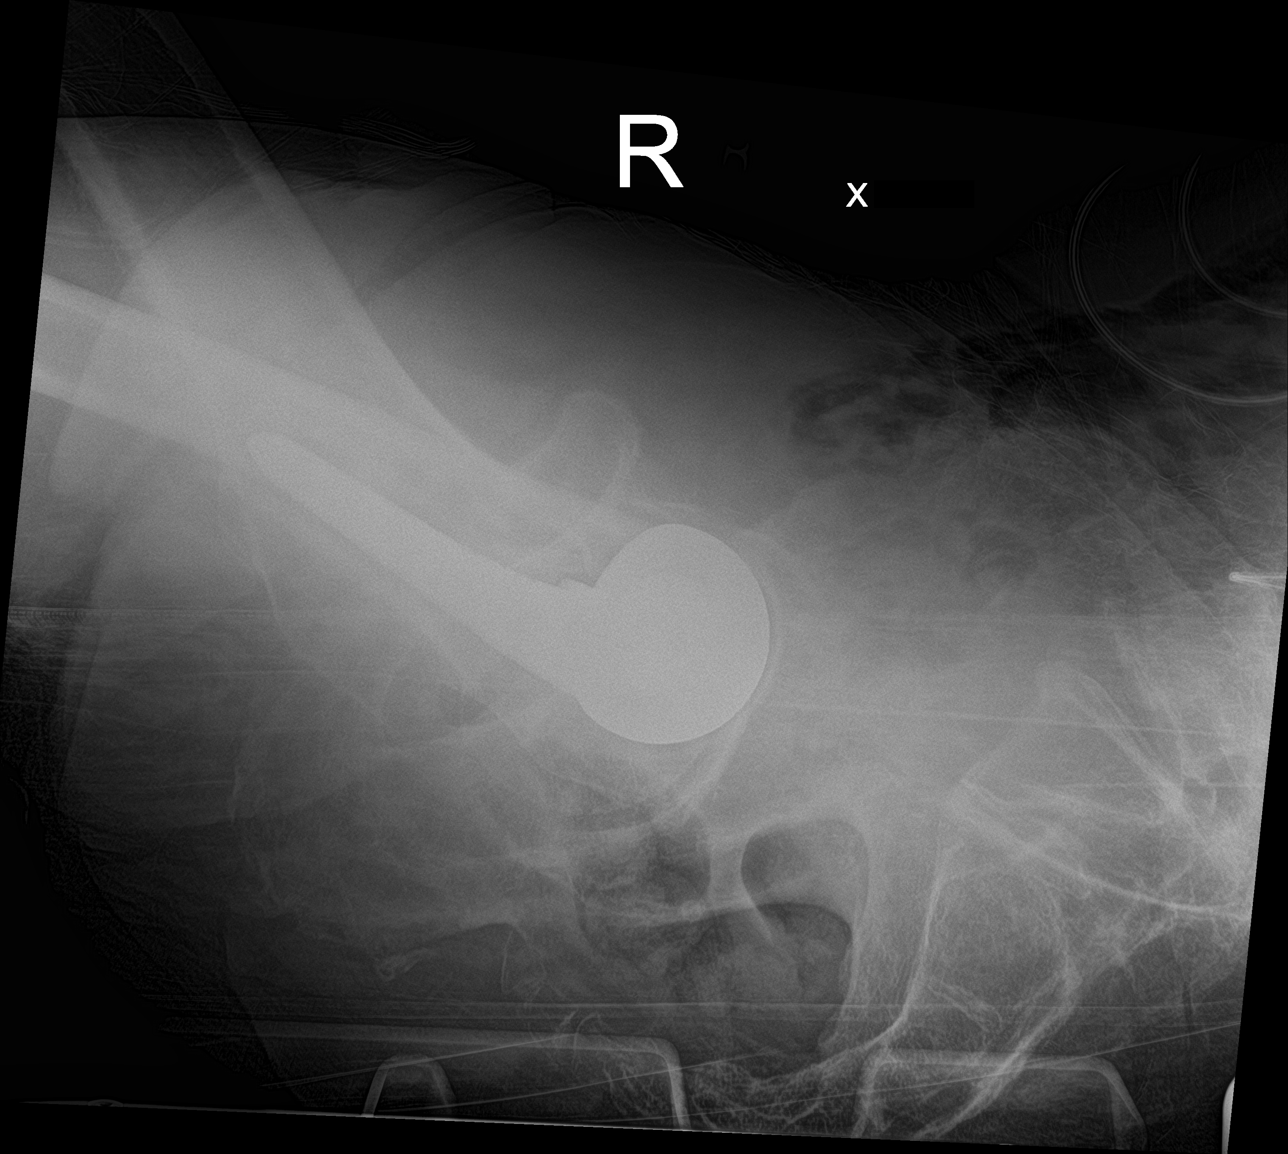

[4 of 4 positions shown; findings below may reference images not displayed]

FINDINGS: Status post right hip replacement without dislocation. Acute
periprosthetic fracture involving the proximal shaft of the femur.
Mild varus angulation of distal fracture fragment and close to 1
bone with of anterior and medial displacement of distal fracture
fragment.
IMPRESSION: Status post right hip replacement with acute displaced and slightly
angulated periprosthetic fracture involving the proximal shaft of
the femur

## 2020-01-20 DIAGNOSIS — E538 Deficiency of other specified B group vitamins: Secondary | ICD-10-CM | POA: Diagnosis not present

## 2020-01-20 DIAGNOSIS — K5901 Slow transit constipation: Secondary | ICD-10-CM | POA: Diagnosis not present

## 2020-01-27 DIAGNOSIS — D692 Other nonthrombocytopenic purpura: Secondary | ICD-10-CM | POA: Diagnosis not present

## 2020-02-10 DIAGNOSIS — I6203 Nontraumatic chronic subdural hemorrhage: Secondary | ICD-10-CM | POA: Diagnosis not present

## 2020-02-10 DIAGNOSIS — I739 Peripheral vascular disease, unspecified: Secondary | ICD-10-CM | POA: Diagnosis not present

## 2020-02-10 DIAGNOSIS — H9190 Unspecified hearing loss, unspecified ear: Secondary | ICD-10-CM | POA: Diagnosis not present

## 2020-02-10 DIAGNOSIS — D692 Other nonthrombocytopenic purpura: Secondary | ICD-10-CM | POA: Diagnosis not present

## 2020-02-24 DIAGNOSIS — G4700 Insomnia, unspecified: Secondary | ICD-10-CM | POA: Diagnosis not present

## 2020-02-24 DIAGNOSIS — K5900 Constipation, unspecified: Secondary | ICD-10-CM | POA: Diagnosis not present

## 2020-02-24 DIAGNOSIS — F259 Schizoaffective disorder, unspecified: Secondary | ICD-10-CM | POA: Diagnosis not present

## 2020-03-09 DIAGNOSIS — G4700 Insomnia, unspecified: Secondary | ICD-10-CM | POA: Diagnosis not present

## 2020-03-09 DIAGNOSIS — K5901 Slow transit constipation: Secondary | ICD-10-CM | POA: Diagnosis not present

## 2020-03-23 DIAGNOSIS — I739 Peripheral vascular disease, unspecified: Secondary | ICD-10-CM | POA: Diagnosis not present

## 2020-03-30 DIAGNOSIS — E538 Deficiency of other specified B group vitamins: Secondary | ICD-10-CM | POA: Diagnosis not present

## 2020-03-30 DIAGNOSIS — G4700 Insomnia, unspecified: Secondary | ICD-10-CM | POA: Diagnosis not present
